# Patient Record
Sex: Female | Born: 1960 | Race: White | Hispanic: No | Marital: Married | Smoking: Never smoker
Health system: Southern US, Community
[De-identification: ages and names within clinical notes are randomized; demographics above are authoritative.]

## PROBLEM LIST (undated history)

## (undated) DIAGNOSIS — M1990 Unspecified osteoarthritis, unspecified site: Secondary | ICD-10-CM

## (undated) DIAGNOSIS — I4891 Unspecified atrial fibrillation: Secondary | ICD-10-CM

## (undated) DIAGNOSIS — S0300XA Dislocation of jaw, unspecified side, initial encounter: Secondary | ICD-10-CM

## (undated) DIAGNOSIS — E785 Hyperlipidemia, unspecified: Secondary | ICD-10-CM

## (undated) DIAGNOSIS — I10 Essential (primary) hypertension: Secondary | ICD-10-CM

## (undated) DIAGNOSIS — R0602 Shortness of breath: Secondary | ICD-10-CM

## (undated) DIAGNOSIS — E039 Hypothyroidism, unspecified: Secondary | ICD-10-CM

## (undated) DIAGNOSIS — R0781 Pleurodynia: Secondary | ICD-10-CM

## (undated) DIAGNOSIS — R7301 Impaired fasting glucose: Secondary | ICD-10-CM

## (undated) DIAGNOSIS — M810 Age-related osteoporosis without current pathological fracture: Secondary | ICD-10-CM

## (undated) DIAGNOSIS — Z9889 Other specified postprocedural states: Secondary | ICD-10-CM

## (undated) DIAGNOSIS — E119 Type 2 diabetes mellitus without complications: Secondary | ICD-10-CM

## (undated) DIAGNOSIS — R112 Nausea with vomiting, unspecified: Secondary | ICD-10-CM

## (undated) DIAGNOSIS — R002 Palpitations: Secondary | ICD-10-CM

## (undated) DIAGNOSIS — R9439 Abnormal result of other cardiovascular function study: Secondary | ICD-10-CM

## (undated) DIAGNOSIS — R079 Chest pain, unspecified: Secondary | ICD-10-CM

## (undated) DIAGNOSIS — R011 Cardiac murmur, unspecified: Secondary | ICD-10-CM

## (undated) DIAGNOSIS — Z6828 Body mass index (BMI) 28.0-28.9, adult: Secondary | ICD-10-CM

## (undated) HISTORY — DX: Age-related osteoporosis without current pathological fracture: M81.0

## (undated) HISTORY — PX: CERVICAL DISCECTOMY: SHX98

## (undated) HISTORY — DX: Body mass index (BMI) 28.0-28.9, adult: Z68.28

## (undated) HISTORY — DX: Palpitations: R00.2

## (undated) HISTORY — DX: Cardiac murmur, unspecified: R01.1

## (undated) HISTORY — PX: THYROIDECTOMY, PARTIAL: SHX18

## (undated) HISTORY — DX: Pleurodynia: R07.81

## (undated) HISTORY — DX: Hyperlipidemia, unspecified: E78.5

## (undated) HISTORY — DX: Abnormal result of other cardiovascular function study: R94.39

## (undated) HISTORY — DX: Impaired fasting glucose: R73.01

## (undated) HISTORY — DX: Unspecified atrial fibrillation: I48.91

## (undated) HISTORY — DX: Chest pain, unspecified: R07.9

## (undated) HISTORY — DX: Shortness of breath: R06.02

## (undated) HISTORY — DX: Unspecified osteoarthritis, unspecified site: M19.90

## (undated) HISTORY — PX: ABDOMINAL HYSTERECTOMY: SHX81

## (undated) HISTORY — PX: BACK SURGERY: SHX140

## (undated) HISTORY — DX: Hypothyroidism, unspecified: E03.9

## (undated) HISTORY — PX: COLONOSCOPY: SHX174

---

## 1998-12-29 ENCOUNTER — Other Ambulatory Visit: Admission: RE | Admit: 1998-12-29 | Discharge: 1998-12-29 | Payer: Self-pay | Admitting: Obstetrics and Gynecology

## 2000-05-03 ENCOUNTER — Other Ambulatory Visit: Admission: RE | Admit: 2000-05-03 | Discharge: 2000-05-03 | Payer: Self-pay | Admitting: *Deleted

## 2001-11-22 ENCOUNTER — Other Ambulatory Visit: Admission: RE | Admit: 2001-11-22 | Discharge: 2001-11-22 | Payer: Self-pay | Admitting: Obstetrics and Gynecology

## 2002-12-10 ENCOUNTER — Other Ambulatory Visit: Admission: RE | Admit: 2002-12-10 | Discharge: 2002-12-10 | Payer: Self-pay | Admitting: Obstetrics and Gynecology

## 2004-11-27 ENCOUNTER — Encounter (INDEPENDENT_AMBULATORY_CARE_PROVIDER_SITE_OTHER): Payer: Self-pay | Admitting: *Deleted

## 2004-11-27 ENCOUNTER — Observation Stay (HOSPITAL_COMMUNITY): Admission: RE | Admit: 2004-11-27 | Discharge: 2004-11-28 | Payer: Self-pay | Admitting: Obstetrics and Gynecology

## 2004-11-27 HISTORY — PX: ABDOMINAL HYSTERECTOMY: SHX81

## 2005-10-29 ENCOUNTER — Ambulatory Visit (HOSPITAL_COMMUNITY): Admission: RE | Admit: 2005-10-29 | Discharge: 2005-10-30 | Payer: Self-pay | Admitting: Specialist

## 2009-11-04 ENCOUNTER — Encounter: Payer: Self-pay | Admitting: Pulmonary Disease

## 2009-11-20 DIAGNOSIS — E039 Hypothyroidism, unspecified: Secondary | ICD-10-CM | POA: Insufficient documentation

## 2009-11-21 ENCOUNTER — Ambulatory Visit: Payer: Self-pay | Admitting: Pulmonary Disease

## 2009-11-21 DIAGNOSIS — R079 Chest pain, unspecified: Secondary | ICD-10-CM | POA: Insufficient documentation

## 2009-12-19 ENCOUNTER — Telehealth (INDEPENDENT_AMBULATORY_CARE_PROVIDER_SITE_OTHER): Payer: Self-pay | Admitting: *Deleted

## 2009-12-22 ENCOUNTER — Ambulatory Visit: Payer: Self-pay | Admitting: Internal Medicine

## 2010-07-14 NOTE — Assessment & Plan Note (Signed)
Summary: consult for atypical chest pain   Copy to:  [facility] Primary Provider/Referring Provider:  Burnell Blanks  CC:  Pulmonary Consult.  History of Present Illness: The pt is a 50y/o female who I have been asked to see for atypical chest pain.  In March of this year, she developed fever, congestion, cough with scant purulence, dyspnea, and a sharp right sided chest pain with radiation to her back.  She was seen at Urgent Care, and tells me that her cxr had some kind of abnormality.  I do not have that report or film for review.  She was treated with nsaids, robaxin for 2 weeks, and the pain improved but did not resolve.  She continued to have isolated "fullness in bottom of right lung",  but denied  having actual pain.  Her current discomfort level is rated 2-3/10.  The only time her chest actually hurts now is with deep breathing while singing.  She denies any further cough, congestion, or mucus.  She has no dyspnea above her usual baseline, and no sob with ADL's.  She did have a f/u cxr at [facility] the end of May which showed no acute process, but questioned whether the pt may have bullous changes at the apices? (but there was not hyperinflation).    Preventive Screening-Counseling & Management  Alcohol-Tobacco     Smoking Status: never  Current Medications (verified): 1)  Armour Thyroid 180 Mg Tabs (Thyroid) .Marland Kitchen.. 1 By Mouth Once Daily 2)  Fosamax 70 Mg Tabs (Alendronate Sodium) .... Take 1 Tab By Mouth Once A Week 3)  Vitamin D (Ergocalciferol) 50000 Unit Caps (Ergocalciferol) .... Take 1 Tab By Mouth Once A Week  Allergies (verified): No Known Drug Allergies  Past History:  Past Medical History:  HYPOTHYROIDISM (ICD-244.9)--?type    Past Surgical History: neck and back surgery partial thyroidectomy 2006 c-section  Family History: Reviewed history and no changes required. emphysema: maternal aunt, maternal great uncle heart disease: father, paternal  grandfather   Social History: Reviewed history and no changes required. Patient never smoked.  pt is married. pt has 3 children. pt works as a Garment/textile technologist. Smoking Status:  never  Review of Systems       The patient complains of chest pain.  The patient denies shortness of breath with activity, shortness of breath at rest, productive cough, non-productive cough, coughing up blood, irregular heartbeats, acid heartburn, indigestion, loss of appetite, weight change, abdominal pain, difficulty swallowing, sore throat, tooth/dental problems, headaches, nasal congestion/difficulty breathing through nose, sneezing, itching, ear ache, anxiety, depression, hand/feet swelling, joint stiffness or pain, rash, change in color of mucus, and fever.    Vital Signs:  Patient profile:   50 year old female Height:      64 inches Weight:      169 pounds BMI:     29.11 O2 Sat:      97 % on Room air Temp:     98.4 degrees F oral Pulse rate:   88 / minute BP sitting:   134 / 78  (left arm) Cuff size:   regular  Vitals Entered By: Arman Filter LPN (November 21, 2009 3:05 PM)  O2 Flow:  Room air CC: Pulmonary Consult Comments Medications reviewed with patient Arman Filter LPN  November 21, 2009 3:05 PM    Physical Exam  General:  ow female in nad Eyes:  PERRLA and EOMI.   Nose:  patent without discharge Mouth:  clear Neck:  no jvd, tmg,  LN Lungs:  totally clear to auscultation Heart:  rrr, no mrg Abdomen:  soft and nontender, bs+ Extremities:  no edema or cyanosis pulses intact distally Neurologic:  alert and oriented, moves all 4.   Impression & Recommendations:  Problem # 1:  CHEST PAIN, ATYPICAL (ICD-786.59)  the pt had what sounds like pleuritic chest pain associated with some type of pulmonary infection.  It is unclear whether this was an occult pna, or whether this was a viral process with pleurisy (coxsackie virus often causes pleurisy...."the grip").  Regardless, the pt is much  improved, and a f/u cxr showed no acute process.  The question was raised whether she may have "emphysema".  Spirometry was done today and showed no significant airflow obstruction.  I have asked her to give this a little more time.  If it does not resolve would consider checking ct chest.  Medications Added to Medication List This Visit: 1)  Fosamax 70 Mg Tabs (Alendronate sodium) .... Take 1 tab by mouth once a week 2)  Vitamin D (ergocalciferol) 50000 Unit Caps (Ergocalciferol) .... Take 1 tab by mouth once a week  Other Orders: Consultation Level IV (16109) Spirometry w/Graph (60454)  Patient Instructions: 1)  there is no evidence for emphysema or asthma on your breathing tests 2)  If your chest discomfort is not improving over next 4 weeks, will consider doing chest ct to evaluate further.   CardioPerfect Spirometry  ID: [id number] Patient: Claudia Shelton, Claudia Shelton DOB: Aug 10, 1960 Age: 50 Years Old Sex: Female Race: White Physician: Lamae Fosco Height: 64 Weight: 169 Status: Confirmed Recorded: 11/21/2009 3:42 PM  Parameter  Measured Predicted %Predicted FVC     2.58        3.51        73.50 FEV1     2.01        2.78        72.20 FEV1%   77.84        80.18        97.10 PEF    5.72        6.70        85.30   Interpretation: fvc is very mildly reduced, suggesting restriction there is no evidence for airflow obstruction

## 2010-07-14 NOTE — Progress Notes (Signed)
Summary: set up CT  Phone Note Call from Patient Call back at Home Phone [phone]   Caller: Patient Call For: clance Summary of Call: pt was seen a month ago for r lung pain. still has pain. was told to call if this continues and kc would have a CT scheduled.  Initial call taken by: Tivis Ringer, CNA,  December 19, 2009 10:04 AM  Follow-up for Phone Call        Pt c/o "right lung" pain is worse since last OV, "feels like something is in there", worse when exercising and laying down on back and right side. Pt c/o it being a sharp constant pain, 4 to 5 of 10. Last OV (11-21-2009), KC discussed if not better in 4 weeks to call and will have CT Chest. Please advise. Zackery Barefoot CMA  December 19, 2009 10:12 AM   Additional Follow-up for Phone Call Additional follow up Details #1::        will send order to [facility] to get chest ct.  let her know they will call her Additional Follow-up by: Barbaraann Share MD,  December 19, 2009 1:41 PM    Additional Follow-up for Phone Call Additional follow up Details #2::    [email or web address] Randell Loop CMA  December 19, 2009 1:49 PM   Spoke with pt.  She already had the ct done today.  Nothing needed. Follow-up by: Vernie Murders,  December 22, 2009 4:10 PM

## 2010-10-30 NOTE — Op Note (Signed)
NAMEMILLA, WAHLBERG                  ACCOUNT NO.:  [id number]   MEDICAL RECORD NO.:  [id number]          PATIENT TYPE:  OBV   LOCATION:  9399                          FACILITY:  [facility]   PHYSICIAN:  [place] B. Earlene Plater, M.D.  DATE OF BIRTH:  Jun 22, 1960   DATE OF PROCEDURE:  11/27/2004  DATE OF DISCHARGE:                                 OPERATIVE REPORT   PREOPERATIVE DIAGNOSIS:  Abnormal bleeding and uterine fibroids.   POSTOPERATIVE DIAGNOSIS:  Abnormal bleeding and uterine fibroids.   OPERATION/PROCEDURE:  Laparoscopic supracervical hysterectomy.   SURGEON:  Chester Holstein. Earlene Plater, M.D.   ASSISTANT:  Cordelia Pen A. Rosalio Macadamia, M.D.  Dr Pascal Lux   ANESTHESIA:  General.   SPECIMENS:  Uterus.   ESTIMATED BLOOD LOSS:  500 mL.   COMPLICATIONS:  None.   INDICATIONS:  The patient with a history of abnormal menstrual bleeding and  associated dysmenorrhea.  Ultrasound suggestive of uterine fibroids.  The  patient was not responding to medical management and desires definitive  minimally invasive surgical therapy.  The patient was advised of the 7% risk  of occasional staining or spotting with a supracervical hysterectomy as well  as a 1% risk of cyclical bleeding and the continued need for Pap smears  postoperatively.  The patient was advised of the risks of surgery including  infection, bleeding, damage to surrounding organs and potential need to  convert to other type of hysterectomy.   DESCRIPTION OF PROCEDURE:  The patient was taken to the operating room and  general anesthesia obtained.  She was prepped and draped in the standard  fashion and Foley catheter inserted into the bladder.  Legs were in Evergreen  stirrups.  Sponge stick placed in the vagina.   A 10 mm incision placed in the umbilicus, carried sharply to the fascia.  The fascia was divided sharply and elevated with Kocher clamps.  Posterior  sheath and peritoneum were entered sharply.  Pursestring suture of 0 Vicryl  placed around the  fascial defect.  Hasson cannula inserted and secured.  Pneumoperitoneum obtained with CO2 gas and Trendelenburg position obtained  and 12 mm trocars were placed in each lower quadrant under direct  laparoscopic visualization.  A 5 mm port was placed in the midline just  above the symphysis.   The course of each ureter was identified and the left round ligament  identified and divided with the Harmonic ACE.  The utero-ovarian ligaments  pedicles were divided in similar manner.  The broad ligaments were serially  clamped with the Harmonic ACE and divided down to the level of the uterine  artery.  The uterine artery was skeletonized and a bladder flap created with  the Harmonic.  The left uterine artery was then clamped with the Harmonic,  cauterized and divided.  This procedure was repeated in its entirety on the  right side in similar fashion.   The uterine cervix was then amputated in a reverse-cone fashion using the  Harmonic ACE in theMcCarus technique.  The cervical os was then cauterized  with the Harmonic for approximately 20 seconds.  There was  one bleeder at  the lateral aspect of the uterine stump on the right and left side.  This  was made hemostatic with the bipolar cautery.  Pelvis was irrigated and no  active bleeding noted.   The morcellator was then introduced.  The uterus was then morcellated in the  standard fashion and fragments removed.  Pelvic irrigated.  No active  bleeding noted.  The cervical stump was then closed with a running stitch of  0 Vicryl.  Interceed was then placed over the cervical stump.  The inferior  ports were removed and gas released. All sites of dissection were  hemostatic. The scope and Hasson cannula were then removed.  The umbilical  port was closed with the previously placed purse-string suture.  The left  lower quadrant and right lower quadrant 12 mm ports were each placed in a Z-  technique and, therefore, per the [place] data not necessary  to have fascial  closure.  Subcutaneous tissue was reapproximated on each side with 4-0  Vicryl.  The skin was closed on each side with Dermabond.   The patient tolerated the procedure well.  No complications.  She was taken  to the recovery room awake, alert and in stable condition.  All counts  correct per the operating room staff.  The weight of the uterus was 388 g.       WBD/MEDQ  D:  11/27/2004  T:  11/27/2004  Job:  [id number]

## 2010-10-30 NOTE — Op Note (Signed)
Claudia Shelton, Claudia Shelton                  ACCOUNT NO.:  [id number]   MEDICAL RECORD NO.:  [id number]          PATIENT TYPE:  AMB   LOCATION:  SDS                          FACILITY:  [facility]   PHYSICIAN:  Kerrin Champagne, M.D.   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   PROCEDURE:  Anterior cervical diskectomy with decompression of the cervical  cord at the C5-C6 level and fusion using a composite graft 8 mm in width in  combination with local bone graft, internal fixation with a 16 mm Alphatec  plate and 14 mm screws.   SURGEON:  Kerrin Champagne, M.D.   ASSISTANT:  Marlowe Kays, M.D.   ANESTHESIA:  General via oral tracheal intubation, Dr. Gypsy Balsam.   ESTIMATED BLOOD LOSS:  50 mL.   COMPLICATIONS:  None.   DISPOSITION:  Patient returned to the PACU in good condition.   HISTORY OF PRESENT ILLNESS:  This patient is a 50 year old right-hand  dominant female with a ten year history of neck pain and discomfort.  Over  the past four months, she has had worsening neck pain and shoulder pain now  with radiation of the right arm into her radial three fingers.  She has  undergone extensive evaluation and MRI study which demonstrates an area of  central stenosis at the C5-C6 level associated with spondylosis changes  involving the posterior aspect of the disk space at this segment causing  narrowing of the spinal canal to 7-8 mm with areas of cord edema.  She is  brought to the operating room to undergo decompression of the cervical cord  via anterior cervical diskectomy and fusion.  Excision of the posterior  endplate pars at the time of her surgery.   INTEROPERATIVE FINDINGS:  The patient was found to have significant  narrowing at  spinal canal associated with the ossification of the posterior  longitudinal ligament in addition to posterior lip osteophytes at the C5-C6  level due to the degenerative disk disease and spondylosis.  The right C6  cervical foramen was narrowed and this was decompressed performing  foraminotomy at this time of her diskectomy and fusion.   DESCRIPTION OF PROCEDURE:  After adequate general anesthesia, the patient in  a beach chair position, the neck in minimal extension, the [place]  horseshoe well padded holding the occiput, 5 pounds cervical halter  traction, a bump under her right buttock elevating the right iliac crest  anteriorly.  The anterior neck was then prepped with Betadine scrub and prep  solution, also over the right iliac crest.  Draped in the usual manner./  Vi-  Drape was used for the neck and right iliac crest.   The patient had the areas of her skin crease marked on the left side at the  expected C5-C6 level.  This was slightly lower  than the preoperative placed  mark on her neck still in line with the patient's skin crease.  This was  first infiltrated Marcaine 0.5% with 1:200,000 epinephrine.  An incision  approximately 2 1/2 inches in length through skin and subcutaneous layers  carried down to the platysma layer.  This was incised in line with the skin  incision.  The interval between the patient's sternocleidomastoid and medial  structures then developed using Metzenbaum scissors and a small pick up.  The interval between the trachea and esophagus medially and carotid sheath  was then developed using Metzenbaum scissors and the anterior aspect of the  cervical spine and the prevertebral fascial layer seen here.   The patient's anterior cervical structures were carefully retracted  medially.  The anterior prevertebral fascial layers were examined.  Here,  the layers were carefully spread identifying the esophagus medially, the  longus colli muscle and the  prevertebral fascia.  The prevertebral fascia  was incised after bipolar electrocautery was used to cauterize this tissue.  A 15 blade scalpel used to incise the prevertebral fascia.  It was then  teased across the midline using Kitner dissector as well as Art therapist  exposing the anterior aspect of the cervical spine.  The carotid tuberosity  was identified and the needle placed at the expected level here, using a  spinal needle with sheath intact only allowing 1 cm to be placed onto the  disk space.  Interoperative C-arm fluoro was then used to ascertain the  position alignment.  This appeared to be at least one disk space below the  expected C5-C6 level.  This needle was then carefully removed under direct  observation using handheld Cloward and [employer].  Kicker dissector was then  used to further expose the anterior aspect of the cervical spine extending  cranially an additional one segment where large anterior osteophytes were  found to be present marking the expected level of C5-C6.  The spinal needle  reintroduced at this level, again, only allowing 1 cm to protrude beyond the  sheath, inserted into the disk space at the C5-C6 segment.  Interoperative C-  arm fluoro was then used to ascertain the correct level of C5-C6 here.  Under direct observation, then this needle was removed and a 15 blade  scalpel used to remove a small portion of the anterior aspect of the annular  disk between osteophytes here, and this was then removed.  Using handheld  Cloward then, the medial border of the longus colli muscles were carefully  freed up bilaterally off the anterior aspect of the cervical spine.  This  was completed with Bovie electrocautery.  Bipolar cautery was used to  control the bleeders.  With this then, a McCullough retractor was inserted  with the blade beneath the longus colli muscle on both sides exposing the  anterior aspect of the disk space at the C5-C6 level.  Electrocautery  was used to debride the anterior aspect of the disk space at the level of the  anterior surface of the vertebral body of C5 and C6 of any soft tissue  material found be present debriding this back to the anterior osteophytes.  Osteophytes were then resected using 3 mm Kerrison.  The bone that was  removed was preserved for use for bone graft purposes within the allograft  to be used.  The endplates were then carefully curettaged of any  degenerative disk material as well as old degenerative cartilage over the  inferior aspect of C5  and the superior aspect of C6.  Once this was debrided  and removed, and the bony endplates carefully curettaged using 2-0  microcurets 3-0 microcurets, using this bone then morselized for the trans-  graft to be used.   The operating room microscope was sterilely draped and brought into field.  Prior to this, head lamp and loupe magnification was used.  Under the  operating room microscope then, a high-speed bur was used to carefully  decorticate the endplates of the inferior aspect of C5 and the superior  aspect of C6 back to the posterior lip osteophytes that were present.  These  were then carefully smoothed.  Once this had been done, a 3-0 straight  microcuret could be introduced and used to excise the small portion of the  posterior lip osteophytes over the left side of the C6 level posterior  superior osteophyte.  1 mm Kerrison was then able to be introduced and used  to excise the bony bridge at the C6 level transversely from the left side to  the right side.  This was then also used to remove bone from the posterior  inferior aspect of C5 on the left side up to an area that was more anterior  and superior to the patient's posterior lip osteophytes and this was  resected transversely from the left side and the right side, resecting the  bony material that was causing cord compression.  The bone material was  removed using pituitary rongeurs.  The  posterior longitudinal ligament  showed areas of calcification particularly centrally over the posterior  superior aspect of C6.  This was carefully developed over the lateral aspect  on the right side using 1 mm Kerrison dividing across this space and  resecting this nicely.  The posterior longitudinal ligament was then  completely resected, both left and right side, the spinal cord felt to be  well decompressed at this point.  A foraminotomy was performed on the right  side of the C6 level using 1 mm and 2 mm Kerrisons until the nerve root was  noted to be exiting anterolaterally without any further compression.  The  height of the intervertebral disk space was then measured using sounder.  An  8 mm sounder provided the best fit.  An 8 mm trans-graft was then obtained.  The Cloward depth gauge used to ascertain the depth of the intervertebral  disk space measured at 17 mm, trans-graft measured at 12 mm depth.  The  trans-graft was obtained sterilely and bone material that had been removed from this patient's decompression was then packed into the central hollow  area of the graft.  A lot of graft was present.  Irrigation was performed,  bony endplates that were bleeding were hemostased using thrombin-soaked  Gelfoam.  The trans-graft was then inserted into place after care was taken  to ensure there was no soft tissue that could be retropulsed with insertion  of graft.  The graft was then impacted into place and slightly subset by 1-2  mm.  The distraction was then released on the 14 mm screw posts that I had  inserted at the beginning of the case and used for distraction across disk  space.  Bone wax was applied to the bleeding screw holes.  Following this,  the 5 pounds of cervical halter traction was released.   Carefully, the anterior aspect of the vertebral body and inferior aspect of  C5 and superior aspect of C6 were debrided of any soft  tissue attachments  here.  And high-speed  bur then used to smooth the anterior lip osteophytes  here.  A 16 mm locking plate was then applied to the anterior aspect of the  cervical spine, a single retaining screw then placed on the left side at the  C6 level holding the plate and screw in excellent position and alignment.  The first screw hole was placed on the left side at C5, 14 mm drill and a 14  mm screw placed.  Then, a retaining screw was removed from the plate and  drilled with a 14 mm drill and then a 14 mm screw on the left side at C5-C6.  Right side C6 screw was next placed using a 14 mm drill then placing  appropriate screw and then on the right side at C5 drilling 14 mm and  placing the appropriate size 14 mm screw.  Inspection of the esophagus  demonstrated no abnormality.  Additional bone graft was placed along the  anterior left side of the graft through the opening of the anterior aspect  of the plate.   With this then, careful irrigation was performed.  Inspection of the  esophagus demonstrated no abnormalities.  The McCullough retractor was  removed.  Interoperative C-arm fluoro demonstrated the plates and screws in  excellent position, alignment screws extending up to the posterior aspect of  the vertebral body but not within the spinal canal proper.  The patient's  graft appeared to be in the anterior 2/3 of the disk space with no evidence  of impingement on the patient's spinal canal.  A 7-French drain was then  placed at the depth of the incision down near the plate on the left side  exiting out the anterior aspect through a separate area.  The platysma layer  was then reapproximated with interrupted 3-0 Vicryl suture after noting  there was no active bleeding present.  After approximating this layer, then  the subcu layers were approximated with interrupted 3-0 Vicryl sutures, skin  closed with Dermabond after applying further subcu sutures to approximate  the skin edges.  Dermabond was then applied nicely  holding skin edges  together year.  The 7-French drain was sewn in place with a 4-0 nylon stitch.  4x4s were affixed to the skin with Hypafix tape.  The patient's TLS  drain was charged.  The patient was then placed into a soft cervical collar,  reactivated, extubated, and returned to the recovery room in satisfactory  condition.  All instrument and sponge counts were correct.   INDICATIONS FOR ASSISTANT:  An assistant medical doctor surgeon was  necessary for suctioning and exposure of the anterior aspect of the cervical  spine for additional help in identifying areas of nerve root compression and  handling the instruments in the very delicate area of the patient's cervical  cord and right C6 nerve root.  Retraction of the cervical structures was  very important in terms of preventing postoperative complications.      Kerrin Champagne, M.D.  Electronically Signed     JEN/MEDQ  D:  10/29/2005  T:  10/30/2005  Job:  [id number]

## 2011-04-06 IMAGING — CT CT ANGIO CHEST
2 of 6 series · 19 of 36 positions shown · IV contrast (Omnipaque 300)
Comparison: Chest x-ray 10/07/2009

CLINICAL DATA: Right lower chest pain.  Shortness of breath.

CT ANGIOGRAPHY CHEST WITH CONTRAST
TECHNIQUE: Multidetector CT imaging of the chest was performed
using the standard protocol during bolus administration of
intravenous contrast.  Multiplanar CT image reconstructions
including MIPs were obtained to evaluate the vascular anatomy.
Contrast:  80 ml Imnipaque-3NN

[Series 5: thins (id) / (id) · axial · 0.62mm/px · z∈[-203,+20]mm · 18 of 249 slices shown]
[im 13/249  lung]
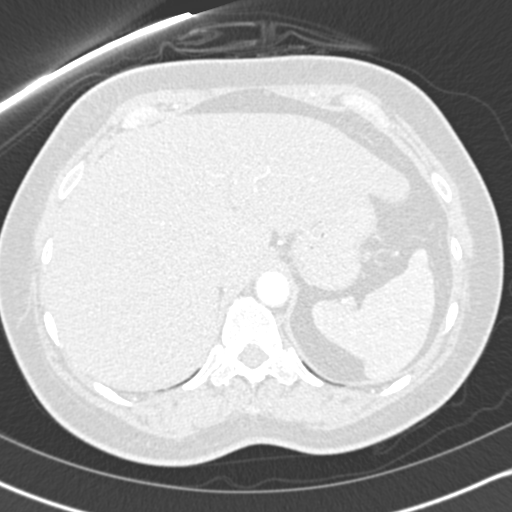
[im 25/249  mediastinal]
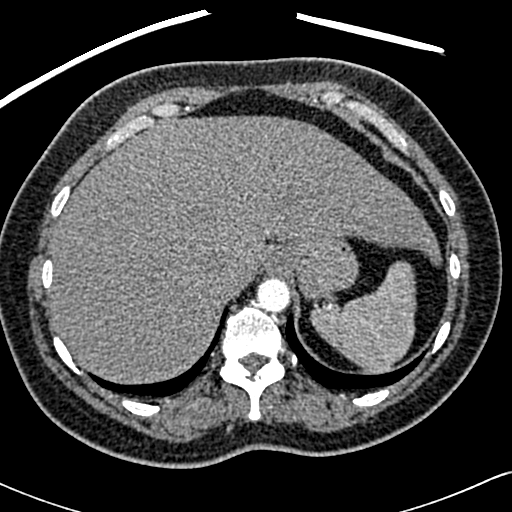
[im 38/249  lung]
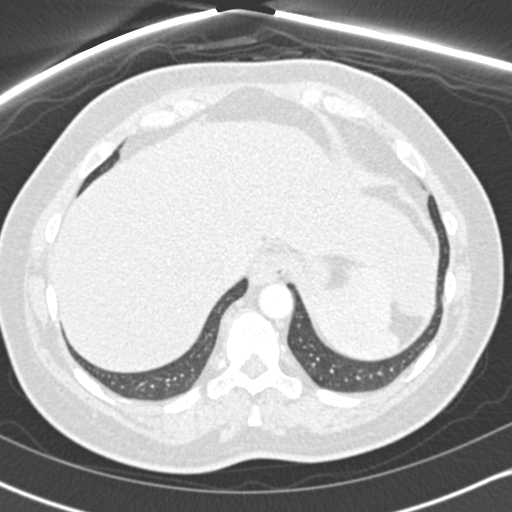
[im 50/249  mediastinal]
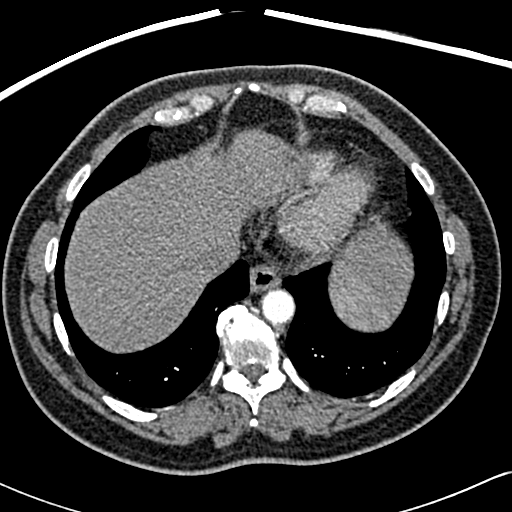
[im 63/249  lung]
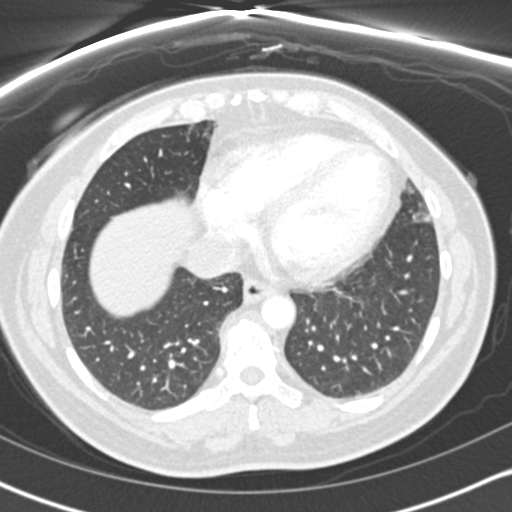
[im 75/249  mediastinal]
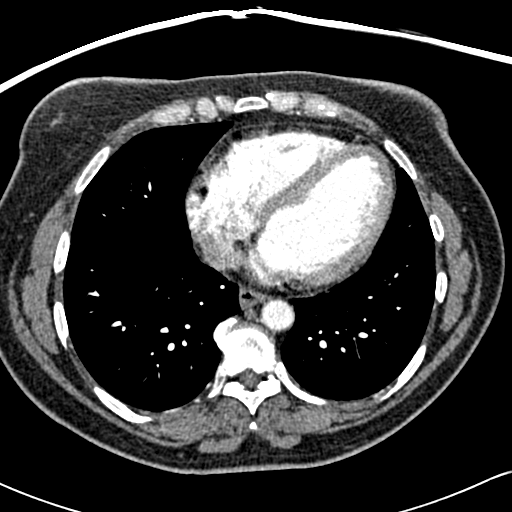
[im 87/249  lung]
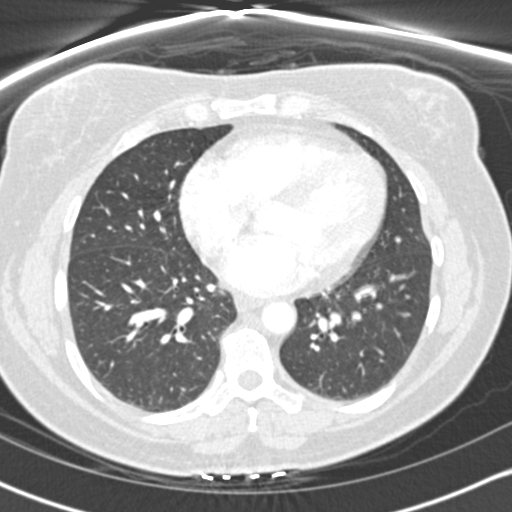
[im 100/249  mediastinal]
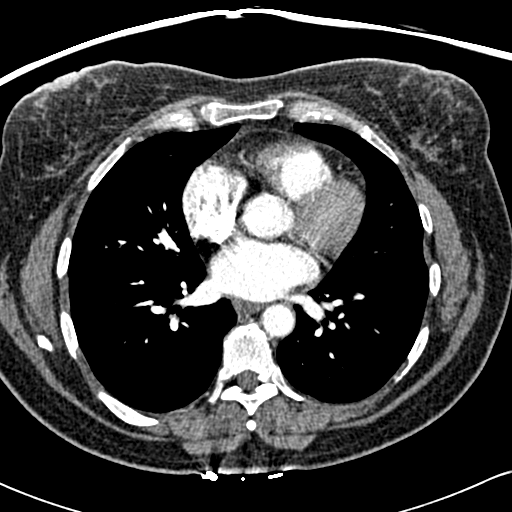
[im 112/249  lung]
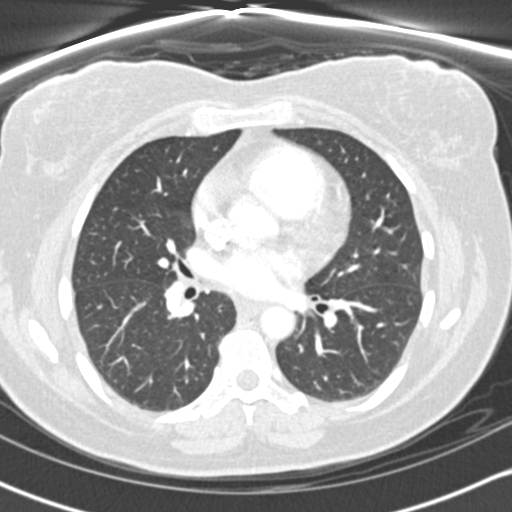
[im 137/249  mediastinal]
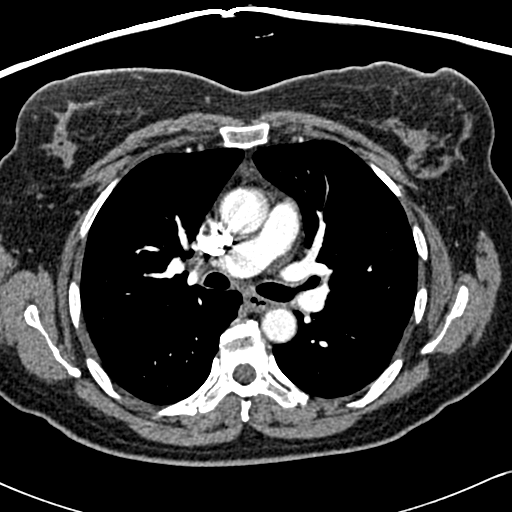
[im 149/249  lung]
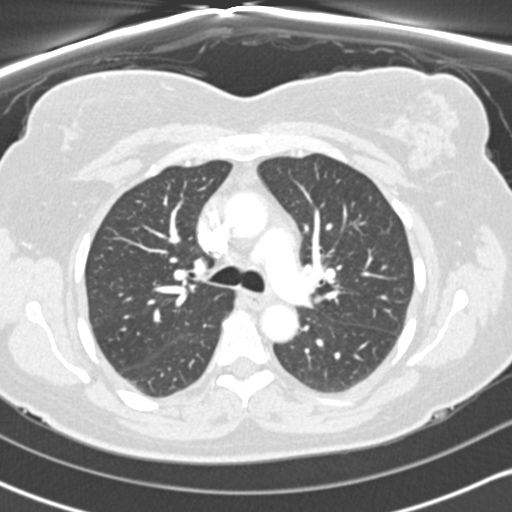
[im 162/249  mediastinal]
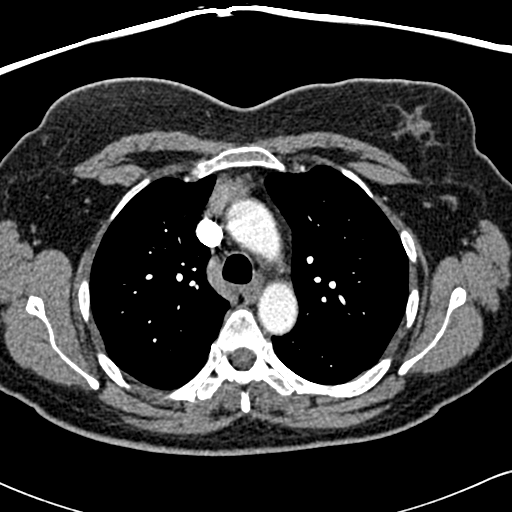
[im 174/249  lung]
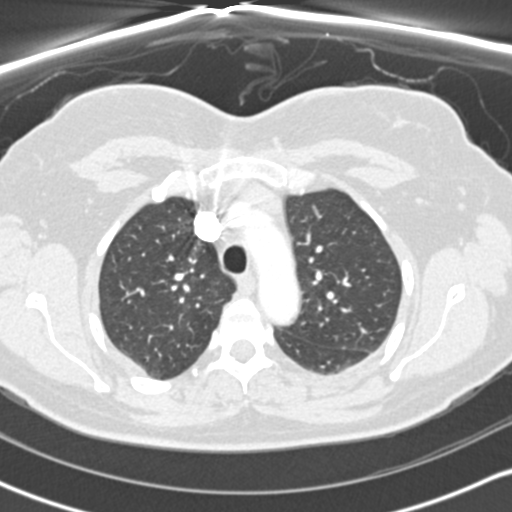
[im 187/249  mediastinal]
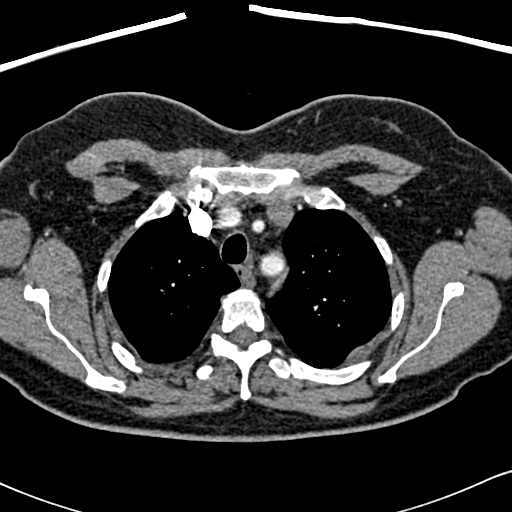
[im 199/249  lung]
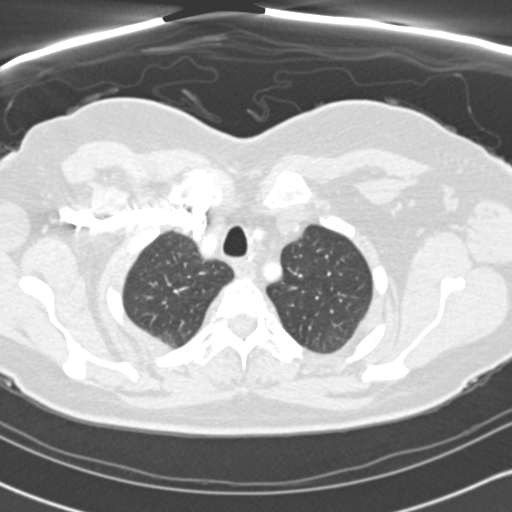
[im 211/249  mediastinal]
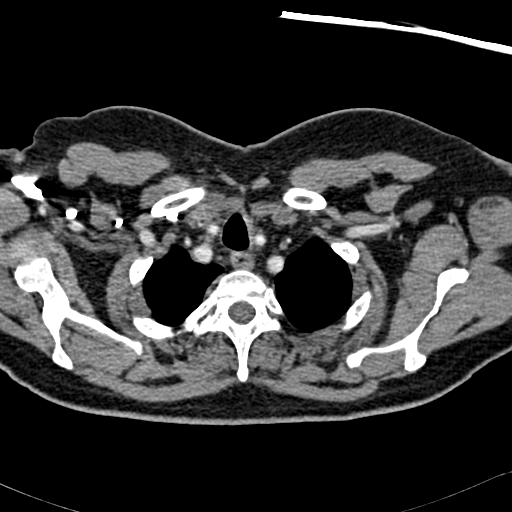
[im 224/249  lung]
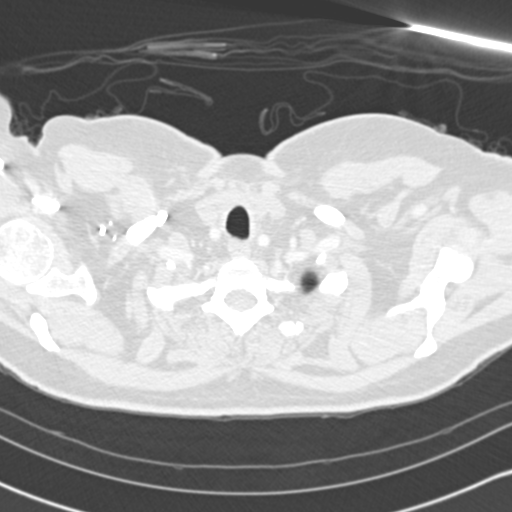
[im 236/249  mediastinal]
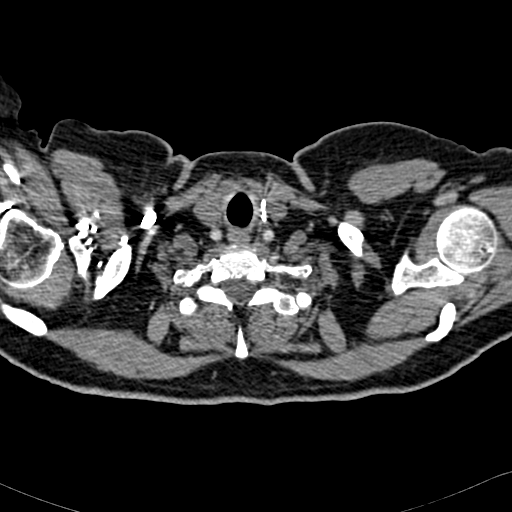

[Series 602: <mpr thick range> · coronal · 0.62mm/px · 1 of 106 slices shown]
[im 53/106  mediastinal]
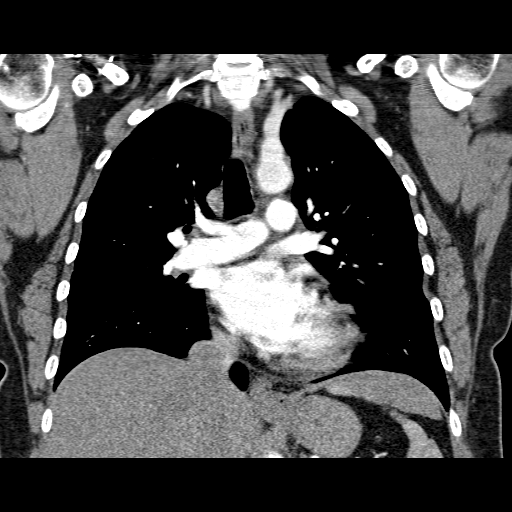

[19 of 36 positions shown; findings below may reference images not displayed]

FINDINGS: There are thyroidectomy changes.  There is soft tissue
in the prevascular space with fatty stippling, which is indicative
of thymic tissue.  No pathologically enlarged mediastinal, hilar or
axillary lymph nodes.  Heart is mildly enlarged.  No pericardial
effusion.  Tiny hiatal hernia.

Lungs are clear.  No pleural fluid.  Airway is unremarkable.

Incidental imaging of the upper abdomen shows no acute findings.
No worrisome lytic or sclerotic lesions.

Review of the MIP images confirms the above findings.
IMPRESSION: No pulmonary embolus.  No CT findings to explain the patient's
given symptoms.

## 2013-06-14 DIAGNOSIS — R0602 Shortness of breath: Secondary | ICD-10-CM

## 2013-06-14 DIAGNOSIS — R079 Chest pain, unspecified: Secondary | ICD-10-CM

## 2013-06-14 HISTORY — DX: Shortness of breath: R06.02

## 2013-06-14 HISTORY — DX: Chest pain, unspecified: R07.9

## 2014-04-09 ENCOUNTER — Encounter: Payer: Self-pay | Admitting: Internal Medicine

## 2014-04-26 ENCOUNTER — Institutional Professional Consult (permissible substitution): Payer: Self-pay | Admitting: Internal Medicine

## 2014-04-30 ENCOUNTER — Encounter: Payer: Self-pay | Admitting: Internal Medicine

## 2014-04-30 ENCOUNTER — Ambulatory Visit (INDEPENDENT_AMBULATORY_CARE_PROVIDER_SITE_OTHER): Payer: BC Managed Care – PPO | Admitting: Internal Medicine

## 2014-04-30 VITALS — BP 140/80 | HR 70 | Ht 63.0 in | Wt 164.2 lb

## 2014-04-30 DIAGNOSIS — R079 Chest pain, unspecified: Secondary | ICD-10-CM

## 2014-04-30 DIAGNOSIS — R002 Palpitations: Secondary | ICD-10-CM | POA: Insufficient documentation

## 2014-04-30 DIAGNOSIS — R0789 Other chest pain: Secondary | ICD-10-CM

## 2014-04-30 NOTE — Progress Notes (Signed)
HPI Claudia Shelton is referred today by Dr. Lisbeth Ply for evaluation of palpitations and exertional chest discomfort. She is an otherwise healthy middle-aged woman with diabetes and dyslipidemia, who is noted palpitations which have occurred for many years. These are not necessarily related to exertion. She describes an episode nearly one year ago where she almost passed out because of sustained heart racing. These episodes have not recurred, but she does feel like her heart beats irregularly irregular at times. She feels some type of palpitations almost every day. In addition, the patient notes substernal chest pressure which she states occurs with exertion. It improves with rest. She has not had syncope. There is no radiation to the neck, jaw, or arm. There is no associated nausea or vomiting. No Known Allergies   Current Outpatient Prescriptions  Medication Sig Dispense Refill  . alendronate (FOSAMAX) 70 MG tablet Take 70 mg by mouth once a week. Take with a full glass of water on an empty stomach.    . ezetimibe-simvastatin (VYTORIN) 10-20 MG per tablet Take 1 tablet by mouth daily.    . metFORMIN (GLUMETZA) 500 MG (MOD) 24 hr tablet Take 500 mg by mouth daily with breakfast.    . Multiple Vitamin (MULTIVITAMIN) capsule Take 1 capsule by mouth daily.    Marland Kitchen thyroid (ARMOUR) 180 MG tablet Take 180 mg by mouth daily.    . Vitamin D, Ergocalciferol, (DRISDOL) 50000 UNITS CAPS capsule Take 50,000 Units by mouth every 7 (seven) days.     No current facility-administered medications for this visit.     Past Medical History  Diagnosis Date  . Pleuritic pain     right lung base  . Jaw disease   . Hypothyroidism   . Prediabetes     F/B Dr. [name]  . Chest pain on exertion 2015  . SOB (shortness of breath) on exertion 2015  . Palpitations   . BMI 28.0-28.9,adult     ROS:   All systems reviewed and negative except as noted in the HPI.   Past Surgical History  Procedure Laterality  Date  . Thyroidectomy Left     "pre-cancerous cells"  . Complete hysterectomy      Not due to cancer. WITH OVARIES SPARED  . Cesarean section      x2  . Cervical discectomy       Family History  Problem Relation Age of Onset  . Diabetes Mellitus I Mother   . Hypertension Father   . Hyperlipidemia Father   . Stroke Paternal Aunt 43  . Heart attack Paternal Uncle   . Heart attack Paternal Grandfather 66  . Heart attack Cousin 45     History   Social History  . Marital Status: Married    Spouse Name: N/A    Number of Children: N/A  . Years of Education: N/A   Occupational History  . Not on file.   Social History Main Topics  . Smoking status: Never Smoker   . Smokeless tobacco: Not on file  . Alcohol Use: No  . Drug Use: Not on file  . Sexual Activity: Not on file   Other Topics Concern  . Not on file   Social History Narrative     BP 140/80 mmHg  Pulse 70  Ht 5\' 3"  (1.6 m)  Wt 164 lb 3.2 oz (74.481 kg)  BMI 29.09 kg/m2  Physical Exam:  Well appearing middle-aged woman, NAD HEENT: Unremarkable Neck:  No JVD, no thyromegally Back:  No CVA tenderness Lungs:  Clear with no wheezes, rales, or rhonchi. HEART:  Regular rate rhythm, no murmurs, no rubs, no clicks Abd:  soft, positive bowel sounds, no organomegally, no rebound, no guarding Ext:  2 plus pulses, no edema, no cyanosis, no clubbing Skin:  No rashes no nodules Neuro:  CN II through XII intact, motor grossly intact  EKG - normal sinus rhythm with normal axis and intervals.   Assess/Plan:

## 2014-04-30 NOTE — Assessment & Plan Note (Signed)
She has both typical as well as atypical chest pressure. I recommended that she undergo exercise treadmill testing. Additional evaluation will be based on the results of her exercise treadmill test.

## 2014-04-30 NOTE — Assessment & Plan Note (Signed)
The etiology of her symptoms is unclear. I suspect she is having PACs and/or PVCs. Because she has her episodes daily, I have recommended that she wear a 48 hour Holter. No medical therapy at this point. She is encouraged to refrain from caffeine and alcohol.

## 2014-04-30 NOTE — Patient Instructions (Signed)
Your physician recommends that you schedule a follow-up appointment in: 4 weeks with Dr. Lovena Le.   Your physician has requested that you have an exercise tolerance test. For further information please visit [email or web address]. Please also follow instruction sheet, as given.   Your physician has recommended that you wear a holter monitor. Holter monitors are medical devices that record the heart's electrical activity. Doctors most often use these monitors to diagnose arrhythmias. Arrhythmias are problems with the speed or rhythm of the heartbeat. The monitor is a small, portable device. You can wear one while you do your normal daily activities. This is usually used to diagnose what is causing palpitations/syncope (passing out).

## 2014-06-05 ENCOUNTER — Encounter: Payer: Self-pay | Admitting: Internal Medicine

## 2014-06-06 ENCOUNTER — Encounter: Payer: BC Managed Care – PPO | Admitting: *Deleted

## 2014-06-06 ENCOUNTER — Encounter (INDEPENDENT_AMBULATORY_CARE_PROVIDER_SITE_OTHER): Payer: BC Managed Care – PPO | Admitting: *Deleted

## 2014-06-06 ENCOUNTER — Ambulatory Visit (INDEPENDENT_AMBULATORY_CARE_PROVIDER_SITE_OTHER): Payer: BC Managed Care – PPO | Admitting: Physician Assistant

## 2014-06-06 VITALS — BP 149/86 | HR 77

## 2014-06-06 DIAGNOSIS — R079 Chest pain, unspecified: Secondary | ICD-10-CM

## 2014-06-06 DIAGNOSIS — R002 Palpitations: Secondary | ICD-10-CM

## 2014-06-06 DIAGNOSIS — R9439 Abnormal result of other cardiovascular function study: Secondary | ICD-10-CM

## 2014-06-06 MED ORDER — METOPROLOL SUCCINATE ER 25 MG PO TB24: 25.0000 mg | ORAL_TABLET | Freq: Every day | ORAL | Status: DC

## 2014-06-06 MED ORDER — NITROGLYCERIN 0.4 MG SL SUBL: 0.4000 mg | SUBLINGUAL_TABLET | SUBLINGUAL | Status: DC | PRN

## 2014-06-06 NOTE — H&P (Signed)
History and Physical   Date:  06/06/2014   ID:  Claudia Shelton, DOB 02-19-1961, MRN [id number]  PCP:  Leonides Sake, MD  Cardiologist:  Dr. Cristopher Peru     History of Present Illness: Claudia Shelton is a 53 y.o. female recently seen by Dr. Cristopher Peru for evaluation of palpitations and chest discomfort.  She has a hx of HL, pre-diabetes, cervical disc disease, hypothyroidism.  She was set up for an ETT that was done today. This was abnormal with development of chest pain during exercise, marked ST depression and hypertensive BP response.  Symptoms and ECGs resolved in recovery.  She will be set up for a L heart cath.   Recent Labs: No results found for requested labs within last 365 days.    Recent Radiology: No results found.    Wt Readings from Last 3 Encounters:  04/30/14 164 lb 3.2 oz (74.481 kg)  11/21/09 169 lb (76.658 kg)     Past Medical History  Diagnosis Date  . Pleuritic pain     right lung base  . Jaw disease   . Hypothyroidism   . Prediabetes     F/B Dr. [name]  . Chest pain on exertion 2015  . SOB (shortness of breath) on exertion 2015  . Palpitations   . BMI 28.0-28.9,adult     Current Outpatient Prescriptions  Medication Sig Dispense Refill  . alendronate (FOSAMAX) 70 MG tablet Take 70 mg by mouth once a week. Take with a full glass of water on an empty stomach.    . ezetimibe-simvastatin (VYTORIN) 10-20 MG per tablet Take 1 tablet by mouth daily.    . metFORMIN (GLUMETZA) 500 MG (MOD) 24 hr tablet Take 500 mg by mouth daily with breakfast.    . Multiple Vitamin (MULTIVITAMIN) capsule Take 1 capsule by mouth daily.    Marland Kitchen thyroid (ARMOUR) 180 MG tablet Take 180 mg by mouth daily.    . Vitamin D, Ergocalciferol, (DRISDOL) 50000 UNITS CAPS capsule Take 50,000 Units by mouth every 7 (seven) days.     No current facility-administered medications for this visit.     Allergies:   Review of patient's allergies indicates no known allergies.   Social  History:  The patient  reports that she has never smoked. She does not have any smokeless tobacco history on file. She reports that she does not drink alcohol.   Family History:  The patient's family history includes Diabetes Mellitus I in her mother; Heart attack in her paternal uncle; Heart attack (age of onset: 49) in her cousin; Heart attack (age of onset: 31) in her paternal grandfather; Hyperlipidemia in her father; Hypertension in her father; Stroke (age of onset: 44) in her paternal aunt.    ROS:  Please see the history of present illness.       All other systems reviewed and negative.    PHYSICAL EXAM: VS:  BP 149/86 mmHg  Pulse 77 Well nourished, well developed, in no acute distress HEENT: normal Neck:  no JVD Cardiac:  normal S1, S2;  RRR; no murmur   Lungs:   clear to auscultation bilaterally, no wheezing, rhonchi or rales Abd: soft, nontender, no hepatomegaly Ext: no edema Skin: warm and dry Neuro:  CNs 2-12 intact, no focal abnormalities noted       Exercise Treadmill Test  Pre-Exercise Testing Evaluation Rhythm: normal sinus  Rate: 77     Test  Exercise Tolerance Test Ordering MD: Cristopher Peru, MD  Interpreting  MD: Richardson Dopp, PA-C  Unique Test No: 1  Treadmill:  1  Indication for ETT: chest pain - rule out ischemia  Contraindication to ETT: No   Stress Modality: exercise - treadmill  Cardiac Imaging Performed: non   Protocol: standard Bruce - maximal  Max BP:  236/80  Max MPHR (bpm):  166 85% MPR (bpm):  141  MPHR obtained (bpm):  155 % MPHR obtained:  93  Reached 85% MPHR (min:sec):  4:18 Total Exercise Time (min-sec):  5:41  Workload in METS:  7.0 Borg Scale: 15  Reason ETT Terminated:  exaggerated hypertensive response    ST Segment Analysis At Rest: normal ST segments - no evidence of significant ST depression With Exercise: significant ischemic ST depression  Other Information Arrhythmia:  No Angina during ETT:  present (1) Quality of ETT:   diagnostic  ETT Interpretation:  abnormal - evidence of ST depression consistent with ischemia  Comments: Fair exercise capacity. Patient complained of chest pain during exercise. Exaggerated hypertensive BP response to exercise. There was significant 2-3 mm ST depression at peak exercise.   Recommendations: Discussed with Dr. Virl Axe. Arrange LHC.   ASSESSMENT AND PLAN:  1.  Chest Pain:  Her ETT is markedly abnormal.  She has exertional chest pain and significant risk factors for CAD.  I have reviewed her case with Dr. Virl Axe (DOD).  Cardiac catheterization has been recommended.  Risks and benefits of cardiac catheterization have been discussed with the patient.  These include bleeding, infection, kidney damage, stroke, heart attack, death.  The patient understands these risks and is willing to proceed.  This will be arranged next week.    -  Start Toprol-XL 25 mg QD.    -  Start ASA 81 mg QD.    -  Rx for NTG prn for chest pain.    -  Go to ED if symptoms change or worsen.  Disposition:   FU with me or Dr. Cristopher Peru 2 weeks after cardiac cath.    Signed, Versie Starks, MHS 06/06/2014 10:54 AM    [facility] [place], [place], [place] [employer]  [address] Phone: [phone]; Fax: [phone]

## 2014-06-06 NOTE — Progress Notes (Signed)
Patient ID: Claudia Shelton, female   DOB: December 20, 1960, 53 y.o.   MRN: [id number] [employer] 48 hour holter monitor applied with 7m red dot electrodes.  Patient has tape sensitivity.

## 2014-06-06 NOTE — Progress Notes (Signed)
Cardiology Office Note   Date:  06/06/2014   ID:  Claudia Shelton, DOB [birth year], MRN [id number]  PCP:  Leonides Sake, MD  Cardiologist:  Dr. Cristopher Peru     History of Present Illness: Claudia Shelton is a 53 y.o. female recently seen by Dr. Cristopher Peru for evaluation of palpitations and chest discomfort.  She has a hx of HL, pre-diabetes, cervical disc disease, hypothyroidism.  She was set up for an ETT that was done today. This was abnormal with development of chest pain during exercise, marked ST depression and hypertensive BP response.  Symptoms and ECGs resolved in recovery.  She will be set up for a L heart cath.   Recent Labs: No results found for requested labs within last 365 days.    Recent Radiology: No results found.    Wt Readings from Last 3 Encounters:  04/30/14 164 lb 3.2 oz (74.481 kg)  11/21/09 169 lb (76.658 kg)     Past Medical History  Diagnosis Date  . Pleuritic pain     right lung base  . Jaw disease   . Hypothyroidism   . Prediabetes     F/B Dr. [name]  . Chest pain on exertion 2015  . SOB (shortness of breath) on exertion 2015  . Palpitations   . BMI 28.0-28.9,adult     Current Outpatient Prescriptions  Medication Sig Dispense Refill  . alendronate (FOSAMAX) 70 MG tablet Take 70 mg by mouth once a week. Take with a full glass of water on an empty stomach.    . ezetimibe-simvastatin (VYTORIN) 10-20 MG per tablet Take 1 tablet by mouth daily.    . metFORMIN (GLUMETZA) 500 MG (MOD) 24 hr tablet Take 500 mg by mouth daily with breakfast.    . Multiple Vitamin (MULTIVITAMIN) capsule Take 1 capsule by mouth daily.    Marland Kitchen thyroid (ARMOUR) 180 MG tablet Take 180 mg by mouth daily.    . Vitamin D, Ergocalciferol, (DRISDOL) 50000 UNITS CAPS capsule Take 50,000 Units by mouth every 7 (seven) days.     No current facility-administered medications for this visit.     Allergies:   Review of patient's allergies indicates no known allergies.    Social History:  The patient  reports that she has never smoked. She does not have any smokeless tobacco history on file. She reports that she does not drink alcohol.   Family History:  The patient's family history includes Diabetes Mellitus I in her mother; Heart attack in her paternal uncle; Heart attack (age of onset: 24) in her cousin; Heart attack (age of onset: 29) in her paternal grandfather; Hyperlipidemia in her father; Hypertension in her father; Stroke (age of onset: 106) in her paternal aunt.    ROS:  Please see the history of present illness.       All other systems reviewed and negative.    PHYSICAL EXAM: VS:  BP 149/86 mmHg  Pulse 77 Well nourished, well developed, in no acute distress HEENT: normal Neck:  no JVD Cardiac:  normal S1, S2;  RRR; no murmur   Lungs:   clear to auscultation bilaterally, no wheezing, rhonchi or rales Abd: soft, nontender, no hepatomegaly Ext: no edema Skin: warm and dry Neuro:  CNs 2-12 intact, no focal abnormalities noted       Exercise Treadmill Test  Pre-Exercise Testing Evaluation Rhythm: normal sinus  Rate: 77     Test  Exercise Tolerance Test Ordering MD: Cristopher Peru, MD  Interpreting  MD: Richardson Dopp, PA-C  Unique Test No: 1  Treadmill:  1  Indication for ETT: chest pain - rule out ischemia  Contraindication to ETT: No   Stress Modality: exercise - treadmill  Cardiac Imaging Performed: non   Protocol: standard Bruce - maximal  Max BP:  236/80  Max MPHR (bpm):  166 85% MPR (bpm):  141  MPHR obtained (bpm):  155 % MPHR obtained:  93  Reached 85% MPHR (min:sec):  4:18 Total Exercise Time (min-sec):  5:41  Workload in METS:  7.0 Borg Scale: 15  Reason ETT Terminated:  exaggerated hypertensive response    ST Segment Analysis At Rest: normal ST segments - no evidence of significant ST depression With Exercise: significant ischemic ST depression  Other Information Arrhythmia:  No Angina during ETT:  present (1) Quality of  ETT:  diagnostic  ETT Interpretation:  abnormal - evidence of ST depression consistent with ischemia  Comments: Fair exercise capacity. Patient complained of chest pain during exercise. Exaggerated hypertensive BP response to exercise. There was significant 2-3 mm ST depression at peak exercise.   Recommendations: Discussed with Dr. Virl Axe. Arrange LHC.   ASSESSMENT AND PLAN:  1.  Chest Pain:  Her ETT is markedly abnormal.  She has exertional chest pain and significant risk factors for CAD.  I have reviewed her case with Dr. Virl Axe (DOD).  Cardiac catheterization has been recommended.  Risks and benefits of cardiac catheterization have been discussed with the patient.  These include bleeding, infection, kidney damage, stroke, heart attack, death.  The patient understands these risks and is willing to proceed.  This will be arranged next week.    -  Start Toprol-XL 25 mg QD.    -  Start ASA 81 mg QD.    -  Rx for NTG prn for chest pain.    -  Go to ED if symptoms change or worsen.  Disposition:   FU with me or Dr. Cristopher Peru 2 weeks after cardiac cath.    Signed, Versie Starks, MHS 06/06/2014 10:54 AM    [facility] [place], [place], [place]  [address] Phone: [phone]; Fax: [phone]

## 2014-06-06 NOTE — Patient Instructions (Signed)
Your physician has recommended you make the following change in your medication:   START TAKING NITROGLYCERIN 0.4 SUBLINGUAL TABLET IF NEEDED FOR  CHEST PAIN   START TAKING  TOPROL 25 MG ONCE A DAY   START TAKING  ASPIRIN 81 MG ONCE A DAY OVER THE COUNTER    Your physician has requested that you have a cardiac catheterization. Cardiac catheterization is used to diagnose and/or treat various heart conditions. Doctors may recommend this procedure for a number of different reasons. The most common reason is to evaluate chest pain. Chest pain can be a symptom of coronary artery disease (CAD), and cardiac catheterization can show whether plaque is narrowing or blocking your heart's arteries. This procedure is also used to evaluate the valves, as well as measure the blood flow and oxygen levels in different parts of your heart. For further information please visit [email or web address]. Please follow instruction sheet, as given.  YOU HAVE BEEN SCHEDULED FOR A LEFT HEART CATH ON 06/12/14 @ 10 AM  WITH DR [place] @ 8AM . PLEASE MAKES SURE YOU HAVE NOTHING TO EAT OR DRINK AFTER MIDNIGHT   THE MORNING OF THE PROCEDURE YOU MAY TAKE YOUR MEDS WITH WATER EXCEPT METFORMIN (HOLD 24 HOURS BEFORE PROCEDURE AND 48 HOURS AFTER PROCEDURE)  MAKE SURE YOU HAVE A CHANGE OF CLOTHING ALONG WITH A DESIGNATED DRIVER TO ASSIST YOU ONCE YOUR DISCHARGED TO [place]    Your physician recommends that you schedule a follow-up appointment in: WITH [facility]  FOR A 2 WEEKS POST CATH FOLLOW UP  AFTER 06/12/14    Nitroglycerin sublingual tablets What is this medicine? NITROGLYCERIN (nye troe GLI ser in) is a type of vasodilator. It relaxes blood vessels, increasing the blood and oxygen supply to your heart. This medicine is used to relieve chest pain caused by angina. It is also used to prevent chest pain before activities like climbing stairs, going outdoors in cold weather, or sexual  activity. This medicine may be used for other purposes; ask your health care provider or pharmacist if you have questions. COMMON BRAND NAME(S): Nitroquick, Nitrostat, Nitrotab What should I tell my health care provider before I take this medicine? They need to know if you have any of these conditions: -anemia -head injury, recent stroke, or bleeding in the brain -liver disease -previous heart attack -an unusual or allergic reaction to nitroglycerin, other medicines, foods, dyes, or preservatives -pregnant or trying to get pregnant -breast-feeding How should I use this medicine? Take this medicine by mouth as needed. At the first sign of an angina attack (chest pain or tightness) place one tablet under your tongue. You can also take this medicine 5 to 10 minutes before an event likely to produce chest pain. Follow the directions on the prescription label. Let the tablet dissolve under the tongue. Do not swallow whole. Replace the dose if you accidentally swallow it. It will help if your mouth is not dry. Saliva around the tablet will help it to dissolve more quickly. Do not eat or drink, smoke or chew tobacco while a tablet is dissolving. If you are not better within 5 minutes after taking ONE dose of nitroglycerin, call 9-1-1 immediately to seek emergency medical care. Do not take more than 3 nitroglycerin tablets over 15 minutes. If you take this medicine often to relieve symptoms of angina, your doctor or health care professional may provide you with different instructions to manage your symptoms. If symptoms do not go  away after following these instructions, it is important to call 9-1-1 immediately. Do not take more than 3 nitroglycerin tablets over 15 minutes. Talk to your pediatrician regarding the use of this medicine in children. Special care may be needed. Overdosage: If you think you have taken too much of this medicine contact a poison control center or emergency room at once. NOTE: This  medicine is only for you. Do not share this medicine with others. What if I miss a dose? This does not apply. This medicine is only used as needed. What may interact with this medicine? Do not take this medicine with any of the following medications: -certain migraine medicines like ergotamine and dihydroergotamine (DHE) -medicines used to treat erectile dysfunction like sildenafil, tadalafil, and vardenafil -riociguat This medicine may also interact with the following medications: -alteplase -aspirin -heparin -medicines for high blood pressure -medicines for mental depression -other medicines used to treat angina -phenothiazines like chlorpromazine, mesoridazine, prochlorperazine, thioridazine This list may not describe all possible interactions. Give your health care provider a list of all the medicines, herbs, non-prescription drugs, or dietary supplements you use. Also tell them if you smoke, drink alcohol, or use illegal drugs. Some items may interact with your medicine. What should I watch for while using this medicine? Tell your doctor or health care professional if you feel your medicine is no longer working. Keep this medicine with you at all times. Sit or lie down when you take your medicine to prevent falling if you feel dizzy or faint after using it. Try to remain calm. This will help you to feel better faster. If you feel dizzy, take several deep breaths and lie down with your feet propped up, or bend forward with your head resting between your knees. You may get drowsy or dizzy. Do not drive, use machinery, or do anything that needs mental alertness until you know how this drug affects you. Do not stand or sit up quickly, especially if you are an older patient. This reduces the risk of dizzy or fainting spells. Alcohol can make you more drowsy and dizzy. Avoid alcoholic drinks. Do not treat yourself for coughs, colds, or pain while you are taking this medicine without asking your  doctor or health care professional for advice. Some ingredients may increase your blood pressure. What side effects may I notice from receiving this medicine? Side effects that you should report to your doctor or health care professional as soon as possible: -blurred vision -dry mouth -skin rash -sweating -the feeling of extreme pressure in the head -unusually weak or tired Side effects that usually do not require medical attention (report to your doctor or health care professional if they continue or are bothersome): -flushing of the face or neck -headache -irregular heartbeat, palpitations -nausea, vomiting This list may not describe all possible side effects. Call your doctor for medical advice about side effects. You may report side effects to FDA at 1-800-FDA-1088. Where should I keep my medicine? Keep out of the reach of children. Store at room temperature between 20 and 25 degrees C (68 and 77 degrees F). Store in [facility]. Protect from light and moisture. Keep tightly closed. Throw away any unused medicine after the expiration date. NOTE: This sheet is a summary. It may not cover all possible information. If you have questions about this medicine, talk to your doctor, pharmacist, or health care provider.  2015, Elsevier/Gold Standard. (2013-03-29 17:57:36)

## 2014-06-10 ENCOUNTER — Telehealth: Payer: Self-pay | Admitting: Interventional Cardiology

## 2014-06-12 ENCOUNTER — Encounter (HOSPITAL_COMMUNITY)
Admission: RE | Disposition: A | Discharge status: Home / Self Care | Payer: Self-pay | Source: Ambulatory Visit | Attending: Interventional Cardiology

## 2014-06-12 ENCOUNTER — Ambulatory Visit (HOSPITAL_COMMUNITY)
Admission: RE | Admit: 2014-06-12 | Discharge: 2014-06-12 | Disposition: A | Discharge status: Home / Self Care | Payer: BC Managed Care – PPO | Source: Ambulatory Visit | Attending: Interventional Cardiology | Admitting: Interventional Cardiology

## 2014-06-12 ENCOUNTER — Encounter (HOSPITAL_COMMUNITY): Payer: Self-pay | Admitting: Interventional Cardiology

## 2014-06-12 DIAGNOSIS — R9431 Abnormal electrocardiogram [ECG] [EKG]: Secondary | ICD-10-CM | POA: Diagnosis not present

## 2014-06-12 DIAGNOSIS — R079 Chest pain, unspecified: Secondary | ICD-10-CM | POA: Diagnosis present

## 2014-06-12 DIAGNOSIS — R9439 Abnormal result of other cardiovascular function study: Secondary | ICD-10-CM | POA: Insufficient documentation

## 2014-06-12 DIAGNOSIS — I209 Angina pectoris, unspecified: Secondary | ICD-10-CM | POA: Diagnosis present

## 2014-06-12 DIAGNOSIS — E039 Hypothyroidism, unspecified: Secondary | ICD-10-CM | POA: Insufficient documentation

## 2014-06-12 DIAGNOSIS — E785 Hyperlipidemia, unspecified: Secondary | ICD-10-CM | POA: Diagnosis not present

## 2014-06-12 DIAGNOSIS — I5032 Chronic diastolic (congestive) heart failure: Secondary | ICD-10-CM | POA: Insufficient documentation

## 2014-06-12 DIAGNOSIS — I10 Essential (primary) hypertension: Secondary | ICD-10-CM | POA: Diagnosis not present

## 2014-06-12 DIAGNOSIS — Z8249 Family history of ischemic heart disease and other diseases of the circulatory system: Secondary | ICD-10-CM | POA: Insufficient documentation

## 2014-06-12 HISTORY — PX: LEFT HEART CATHETERIZATION WITH CORONARY ANGIOGRAM: SHX5451

## 2014-06-12 LAB — CBC
HCT: 43.4 % (ref 36.0–46.0)
Hemoglobin: 14.5 g/dL (ref 12.0–15.0)
MCH: 29.6 pg (ref 26.0–34.0)
MCHC: 33.4 g/dL (ref 30.0–36.0)
MCV: 88.6 fL (ref 78.0–100.0)
PLATELETS: 244 10*3/uL (ref 150–400)
RBC: 4.9 MIL/uL (ref 3.87–5.11)
RDW: 12.8 % (ref 11.5–15.5)
WBC: 4.6 10*3/uL (ref 4.0–10.5)

## 2014-06-12 LAB — BASIC METABOLIC PANEL
Anion gap: 8 (ref 5–15)
BUN: 13 mg/dL (ref 6–23)
CO2: 28 mmol/L (ref 19–32)
CREATININE: 0.74 mg/dL (ref 0.50–1.10)
Calcium: 9.1 mg/dL (ref 8.4–10.5)
Chloride: 104 mEq/L (ref 96–112)
GFR calc Af Amer: >90 mL/min (ref >90)
Glucose, Bld: 126 mg/dL — ABNORMAL HIGH (ref 70–99)
Potassium: 3.7 mmol/L (ref 3.5–5.1)
SODIUM: 140 mmol/L (ref 135–145)

## 2014-06-12 LAB — PROTIME-INR
INR: 0.93 (ref 0.00–1.49)
Prothrombin Time: 12.5 seconds (ref 11.6–15.2)

## 2014-06-12 LAB — GLUCOSE, CAPILLARY: Glucose-Capillary: 120 mg/dL — ABNORMAL HIGH (ref 70–99)

## 2014-06-12 SURGERY — LEFT HEART CATHETERIZATION WITH CORONARY ANGIOGRAM
Anesthesia: LOCAL

## 2014-06-12 MED ORDER — ASPIRIN 81 MG PO CHEW
81.0000 mg | CHEWABLE_TABLET | ORAL | Status: AC
  Administered 2014-06-12: 81 mg via ORAL

## 2014-06-12 MED ORDER — ACETAMINOPHEN 325 MG PO TABS: 650.0000 mg | ORAL_TABLET | ORAL | Status: DC | PRN

## 2014-06-12 MED ORDER — ONDANSETRON HCL 4 MG/2ML IJ SOLN: 4.0000 mg | Freq: Four times a day (QID) | INTRAMUSCULAR | Status: DC | PRN

## 2014-06-12 MED ORDER — FENTANYL CITRATE 0.05 MG/ML IJ SOLN
INTRAMUSCULAR | Status: AC
  Filled 2014-06-12: qty 2

## 2014-06-12 MED ORDER — MIDAZOLAM HCL 2 MG/2ML IJ SOLN
INTRAMUSCULAR | Status: AC
  Filled 2014-06-12: qty 2

## 2014-06-12 MED ORDER — ASPIRIN 81 MG PO CHEW
CHEWABLE_TABLET | ORAL | Status: AC
  Administered 2014-06-12: 81 mg via ORAL
  Filled 2014-06-12: qty 1

## 2014-06-12 MED ORDER — HEPARIN (PORCINE) IN NACL 2-0.9 UNIT/ML-% IJ SOLN
INTRAMUSCULAR | Status: AC
  Filled 2014-06-12: qty 1000

## 2014-06-12 MED ORDER — SODIUM CHLORIDE 0.9 % IJ SOLN: 3.0000 mL | Freq: Two times a day (BID) | INTRAMUSCULAR | Status: DC

## 2014-06-12 MED ORDER — OXYCODONE-ACETAMINOPHEN 5-325 MG PO TABS
1.0000 | ORAL_TABLET | ORAL | Status: DC | PRN
  Administered 2014-06-12: 1 via ORAL

## 2014-06-12 MED ORDER — SODIUM CHLORIDE 0.9 % IJ SOLN: 3.0000 mL | INTRAMUSCULAR | Status: DC | PRN

## 2014-06-12 MED ORDER — LIDOCAINE HCL (PF) 1 % IJ SOLN
INTRAMUSCULAR | Status: AC
  Filled 2014-06-12: qty 30

## 2014-06-12 MED ORDER — SODIUM CHLORIDE 0.9 % IV SOLN: INTRAVENOUS | Status: AC

## 2014-06-12 MED ORDER — OXYCODONE-ACETAMINOPHEN 5-325 MG PO TABS
ORAL_TABLET | ORAL | Status: AC
  Filled 2014-06-12: qty 1

## 2014-06-12 MED ORDER — SODIUM CHLORIDE 0.9 % IV SOLN
INTRAVENOUS | Status: DC
  Administered 2014-06-12: 09:00:00 via INTRAVENOUS

## 2014-06-12 MED ORDER — VERAPAMIL HCL 2.5 MG/ML IV SOLN
INTRAVENOUS | Status: AC
  Filled 2014-06-12: qty 2

## 2014-06-12 MED ORDER — SODIUM CHLORIDE 0.9 % IV SOLN: 250.0000 mL | INTRAVENOUS | Status: DC | PRN

## 2014-06-12 MED ORDER — HEPARIN SODIUM (PORCINE) 1000 UNIT/ML IJ SOLN
INTRAMUSCULAR | Status: AC
  Filled 2014-06-12: qty 1

## 2014-06-12 NOTE — CV Procedure (Signed)
     Left Heart Catheterization with Coronary Angiography  Report  Claudia Shelton  53 y.o.  female [birth year]  Procedure Date: 06/12/2014 Referring Physician: Richardson Dopp, PA-C. Primary Cardiologist:: Crissie Sickles, M.D.  INDICATIONS: Abnormal exercise treadmill test, hypertensive blood pressure response, and history of exertional chest tightness.  PROCEDURE: 1. Left heart catheterization; 2. Coronary angiography; 3. Left ventriculography  CONSENT:  The risks, benefits, and details of the procedure were explained in detail to the patient. Risks including death, stroke, heart attack, kidney injury, allergy, limb ischemia, bleeding and radiation injury were discussed.  The patient verbalized understanding and wanted to proceed.  Informed written consent was obtained.  PROCEDURE TECHNIQUE:  After Xylocaine anesthesia a 5 French Slender sheath was placed in the right radial artery with an angiocath and the modified Seldinger technique.  Coronary angiography was done using a 5 F JR 4 and JL 3.5 cm diagnostic catheter.  Left ventriculography was done using the JR 4 catheter and hand injection.   Images were reviewed and the case was terminated. Hemostasis was achieved with a wrist band.   CONTRAST:  Total of 110 cc.  COMPLICATIONS:  None   HEMODYNAMICS:  Aortic pressure 150/83 mmHg; LV pressure 154/9 mmHg; LVEDP 23 mmHg   ANGIOGRAPHIC DATA:   The left main coronary artery is normal.  The left anterior descending artery is transapical and normal..  The left circumflex artery is normal.  The right coronary artery is dominant and normal.    LEFT VENTRICULOGRAM:  Left ventricular angiogram was done in the 30 RAO projection and revealed normal cavity size and hyperdynamic contractility. EF is estimated to be greater than 65%.   IMPRESSIONS:  1. Normal coronary arteries 2. Hyperdynamic left ventricular systolic function. Chronic diastolic heart failure with elevated end-diastolic  pressures. 3. Essential hypertension   RECOMMENDATION:  Aggressive blood pressure control and risk factor modification.

## 2014-06-12 NOTE — Interval H&P Note (Signed)
Cath Lab Visit (complete for each Cath Lab visit)  Clinical Evaluation Leading to the Procedure:   ACS: No.  Non-ACS:    Anginal Classification: CCS III  Anti-ischemic medical therapy: Minimal Therapy (1 class of medications)  Non-Invasive Test Results: Intermediate-risk stress test findings: cardiac mortality 1-3%/year  Prior CABG: No previous CABG      History and Physical Interval Note:  06/12/2014 10:07 AM  Claudia Shelton  has presented today for surgery, with the diagnosis of excertional angina, abnormal stress test  The various methods of treatment have been discussed with the patient and family. After consideration of risks, benefits and other options for treatment, the patient has consented to  Procedure(s): LEFT HEART CATHETERIZATION WITH CORONARY ANGIOGRAM (N/A) as a surgical intervention .  The patient's history has been reviewed, patient examined, no change in status, stable for surgery.  I have reviewed the patient's chart and labs.  Questions were answered to the patient's satisfaction.     [name]

## 2014-06-12 NOTE — Discharge Instructions (Signed)
Radial Site Care °Refer to this sheet in the next few weeks. These instructions provide you with information on caring for yourself after your procedure. Your caregiver may also give you more specific instructions. Your treatment has been planned according to current medical practices, but problems sometimes occur. Call your caregiver if you have any problems or questions after your procedure. °HOME CARE INSTRUCTIONS °· You may shower the day after the procedure. Remove the bandage (dressing) and gently wash the site with plain soap and water. Gently pat the site dry. °· Do not apply powder or lotion to the site. °· Do not submerge the affected site in water for 3 to 5 days. °· Inspect the site at least twice daily. °· Do not flex or bend the affected arm for 24 hours. °· No lifting over 5 pounds (2.3 kg) for 5 days after your procedure. °· Do not drive home if you are discharged the same day of the procedure. Have someone else drive you. °· You may drive 24 hours after the procedure unless otherwise instructed by your caregiver. °· Do not operate machinery or power tools for 24 hours. °· A responsible adult should be with you for the first 24 hours after you arrive home. °What to expect: °· Any bruising will usually fade within 1 to 2 weeks. °· Blood that collects in the tissue (hematoma) may be painful to the touch. It should usually decrease in size and tenderness within 1 to 2 weeks. °SEEK IMMEDIATE MEDICAL CARE IF: °· You have unusual pain at the radial site. °· You have redness, warmth, swelling, or pain at the radial site. °· You have drainage (other than a small amount of blood on the dressing). °· You have chills. °· You have a fever or persistent symptoms for more than 72 hours. °· You have a fever and your symptoms suddenly get worse. °· Your arm becomes pale, cool, tingly, or numb. °· You have heavy bleeding from the site. Hold pressure on the site and call 911. °Document Released: 07/03/2010 Document  Revised: 08/23/2011 Document Reviewed: 07/03/2010 °ExitCare® Patient Information ©2015 [employer]. This information is not intended to replace advice given to you by your health care provider. Make sure you discuss any questions you have with your health care provider. ° °

## 2014-07-08 ENCOUNTER — Ambulatory Visit: Payer: BC Managed Care – PPO | Admitting: Physician Assistant

## 2014-07-16 ENCOUNTER — Encounter: Payer: Self-pay | Admitting: Internal Medicine

## 2014-07-16 ENCOUNTER — Ambulatory Visit (INDEPENDENT_AMBULATORY_CARE_PROVIDER_SITE_OTHER): Payer: BC Managed Care – PPO | Admitting: Internal Medicine

## 2014-07-16 VITALS — BP 130/69 | HR 63 | Ht 63.0 in | Wt 160.2 lb

## 2014-07-16 DIAGNOSIS — R002 Palpitations: Secondary | ICD-10-CM

## 2014-07-16 DIAGNOSIS — R0789 Other chest pain: Secondary | ICD-10-CM

## 2014-07-16 NOTE — Assessment & Plan Note (Signed)
Despite her abnormal stress test, she had no CAD by catheterization. Will follow.

## 2014-07-16 NOTE — Progress Notes (Signed)
HPI Claudia Shelton returns today for followup. I had seen her several months ago with palpitations and chest pressure. Subsequent evaluation with a 48 hour holter demonstrated rare PVC's and NS SVT (less than 5 seconds) and she had an abnormal stress test and underwent catheterization which demonstrated normal LV function and no CAD. In the interim she has been stable. She still has rare palpitations and dyspnea with exertion.  Allergies  Allergen Reactions  . Adhesive [Tape] Hives and Itching  . Latex Hives     Current Outpatient Prescriptions  Medication Sig Dispense Refill  . alendronate (FOSAMAX) 70 MG tablet Take 70 mg by mouth once a week. Take with a full glass of water on an empty stomach.    Francia Greaves THYROID 60 MG tablet Take 180 mg by mouth daily.     Marland Kitchen aspirin 81 MG chewable tablet Chew 81 mg by mouth daily.     Marland Kitchen ezetimibe-simvastatin (VYTORIN) 10-20 MG per tablet Take 1 tablet by mouth daily.    . metFORMIN (GLUCOPHAGE-XR) 500 MG 24 hr tablet Take 500 mg by mouth daily.    . metoprolol succinate (TOPROL XL) 25 MG 24 hr tablet Take 1 tablet (25 mg total) by mouth daily. 30 tablet 6  . Multiple Vitamin (MULTIVITAMIN) capsule Take 1 capsule by mouth daily.    . Vitamin D, Ergocalciferol, (DRISDOL) 50000 UNITS CAPS capsule Take 50,000 Units by mouth every 7 (seven) days.    . nitroGLYCERIN (NITROSTAT) 0.4 MG SL tablet Place 1 tablet (0.4 mg total) under the tongue every 5 (five) minutes as needed for chest pain. (Patient not taking: Reported on 07/16/2014) 25 tablet 3   No current facility-administered medications for this visit.     Past Medical History  Diagnosis Date  . Pleuritic pain     right lung base  . Jaw disease   . Hypothyroidism   . Prediabetes     F/B Dr. [name]  . Chest pain on exertion 2015  . SOB (shortness of breath) on exertion 2015  . Palpitations   . BMI 28.0-28.9,adult     ROS:   All systems reviewed and negative except as noted in the  HPI.   Past Surgical History  Procedure Laterality Date  . Thyroidectomy Left     "pre-cancerous cells"  . Complete hysterectomy      Not due to cancer. WITH OVARIES SPARED  . Cesarean section      x2  . Cervical discectomy    . Left heart catheterization with coronary angiogram N/A 06/12/2014    Procedure: LEFT HEART CATHETERIZATION WITH CORONARY ANGIOGRAM;  Surgeon: [name], MD;  Location: [facility] CATH LAB;  Service: Cardiovascular;  Laterality: N/A;     Family History  Problem Relation Age of Onset  . Diabetes Mellitus I Mother   . Hypertension Father   . Hyperlipidemia Father   . Stroke Paternal Aunt 17  . Heart attack Paternal Uncle   . Heart attack Paternal Grandfather 55  . Heart attack Cousin 45     History   Social History  . Marital Status: Married    Spouse Name: N/A    Number of Children: N/A  . Years of Education: N/A   Occupational History  . Not on file.   Social History Main Topics  . Smoking status: Never Smoker   . Smokeless tobacco: Not on file  . Alcohol Use: No  . Drug Use: Not on file  .  Sexual Activity: Not on file   Other Topics Concern  . Not on file   Social History Narrative     BP 130/69 mmHg  Pulse 63  Ht 5\' 3"  (1.6 m)  Wt 160 lb 3.2 oz (72.666 kg)  BMI 28.39 kg/m2  Physical Exam:  Well appearing NAD HEENT: Unremarkable Neck:  No JVD, no thyromegally Lymphatics:  No adenopathy Back:  No CVA tenderness Lungs:  Clear HEART:  Regular rate rhythm, no murmurs, no rubs, no clicks Abd:  soft, positive bowel sounds, no organomegally, no rebound, no guarding Ext:  2 plus pulses, no edema, no cyanosis, no clubbing Skin:  No rashes no nodules Neuro:  CN II through XII intact, motor grossly intact  EKG  DEVICE  Normal device function.  See PaceArt for details.   Assess/Plan:

## 2014-07-16 NOTE — Assessment & Plan Note (Signed)
I discussed the benign nature of her palpitations and recommended she continue on metoprolol, particularly in that she also had a bit of HTN.

## 2014-07-16 NOTE — Patient Instructions (Signed)
Your physician recommends that you schedule a follow-up appointment as needed  

## 2015-01-05 ENCOUNTER — Other Ambulatory Visit: Payer: Self-pay | Admitting: Internal Medicine

## 2015-01-07 ENCOUNTER — Other Ambulatory Visit: Payer: Self-pay

## 2015-01-07 ENCOUNTER — Other Ambulatory Visit: Payer: Self-pay | Admitting: Internal Medicine

## 2015-01-07 MED ORDER — METOPROLOL SUCCINATE ER 25 MG PO TB24: 25.0000 mg | ORAL_TABLET | Freq: Every day | ORAL | Status: DC

## 2015-12-31 ENCOUNTER — Encounter: Payer: Self-pay | Admitting: Gastroenterology

## 2016-01-11 ENCOUNTER — Emergency Department (HOSPITAL_COMMUNITY)
Admission: EM | Admit: 2016-01-11 | Discharge: 2016-01-11 | Disposition: A | Discharge status: Home / Self Care | Payer: BC Managed Care – PPO | Attending: Emergency Medicine | Admitting: Emergency Medicine

## 2016-01-11 ENCOUNTER — Emergency Department (HOSPITAL_COMMUNITY): Payer: BC Managed Care – PPO

## 2016-01-11 ENCOUNTER — Encounter (HOSPITAL_COMMUNITY): Payer: Self-pay | Admitting: Emergency Medicine

## 2016-01-11 DIAGNOSIS — Z91040 Latex allergy status: Secondary | ICD-10-CM | POA: Diagnosis not present

## 2016-01-11 DIAGNOSIS — E039 Hypothyroidism, unspecified: Secondary | ICD-10-CM | POA: Diagnosis not present

## 2016-01-11 DIAGNOSIS — I480 Paroxysmal atrial fibrillation: Secondary | ICD-10-CM | POA: Diagnosis not present

## 2016-01-11 DIAGNOSIS — Z79899 Other long term (current) drug therapy: Secondary | ICD-10-CM | POA: Insufficient documentation

## 2016-01-11 DIAGNOSIS — Z7982 Long term (current) use of aspirin: Secondary | ICD-10-CM | POA: Diagnosis not present

## 2016-01-11 DIAGNOSIS — E876 Hypokalemia: Secondary | ICD-10-CM | POA: Insufficient documentation

## 2016-01-11 DIAGNOSIS — R079 Chest pain, unspecified: Secondary | ICD-10-CM | POA: Diagnosis present

## 2016-01-11 DIAGNOSIS — Z7984 Long term (current) use of oral hypoglycemic drugs: Secondary | ICD-10-CM | POA: Insufficient documentation

## 2016-01-11 LAB — BASIC METABOLIC PANEL
ANION GAP: 13 (ref 5–15)
BUN: 18 mg/dL (ref 6–20)
CALCIUM: 11 mg/dL — AB (ref 8.9–10.3)
CHLORIDE: 99 mmol/L — AB (ref 101–111)
CO2: 25 mmol/L (ref 22–32)
Creatinine, Ser: 0.92 mg/dL (ref 0.44–1.00)
GFR calc non Af Amer: >60 mL/min (ref >60)
GLUCOSE: 144 mg/dL — AB (ref 65–99)
Potassium: 3.3 mmol/L — ABNORMAL LOW (ref 3.5–5.1)
Sodium: 137 mmol/L (ref 135–145)

## 2016-01-11 LAB — CBC
HEMATOCRIT: 47.8 % — AB (ref 36.0–46.0)
HEMOGLOBIN: 16.2 g/dL — AB (ref 12.0–15.0)
MCH: 30.6 pg (ref 26.0–34.0)
MCHC: 33.9 g/dL (ref 30.0–36.0)
MCV: 90.2 fL (ref 78.0–100.0)
Platelets: 289 10*3/uL (ref 150–400)
RBC: 5.3 MIL/uL — AB (ref 3.87–5.11)
RDW: 12.8 % (ref 11.5–15.5)
WBC: 9.6 10*3/uL (ref 4.0–10.5)

## 2016-01-11 LAB — I-STAT TROPONIN, ED: TROPONIN I, POC: 0 ng/mL (ref 0.00–0.08)

## 2016-01-11 MED ORDER — POTASSIUM CHLORIDE CRYS ER 20 MEQ PO TBCR
40.0000 meq | EXTENDED_RELEASE_TABLET | Freq: Once | ORAL | Status: AC
  Administered 2016-01-11: 40 meq via ORAL
  Filled 2016-01-11: qty 2

## 2016-01-11 MED ORDER — DILTIAZEM LOAD VIA INFUSION
15.0000 mg | Freq: Once | INTRAVENOUS | Status: DC
  Filled 2016-01-11: qty 15

## 2016-01-11 MED ORDER — APIXABAN 5 MG PO TABS: 5.0000 mg | ORAL_TABLET | Freq: Two times a day (BID) | ORAL | 0 refills | Status: DC

## 2016-01-11 MED ORDER — FLECAINIDE ACETATE 100 MG PO TABS
300.0000 mg | ORAL_TABLET | Freq: Once | ORAL | Status: DC
  Filled 2016-01-11: qty 3

## 2016-01-11 MED ORDER — APIXABAN 5 MG PO TABS
5.0000 mg | ORAL_TABLET | Freq: Two times a day (BID) | ORAL | Status: DC
  Administered 2016-01-11: 5 mg via ORAL
  Filled 2016-01-11: qty 1

## 2016-01-11 MED ORDER — DILTIAZEM HCL 100 MG IV SOLR
5.0000 mg/h | INTRAVENOUS | Status: DC
  Filled 2016-01-11: qty 100

## 2016-01-11 NOTE — ED Notes (Signed)
Pt converted back into NSR prior to medication administration. MD aware.

## 2016-01-11 NOTE — ED Provider Notes (Signed)
[facility] DEPT Provider Note   CSN: [id number] Arrival date & time: 01/11/16  0132   First MD Initiated Contact with Patient 01/11/16 0224     By signing my name below, I, Claudia Shelton, attest that this documentation has been prepared under the direction and in the presence of Claudia Fuel, MD.  Electronically Signed: [name]. Sheppard Coil, ED Scribe. 01/11/16. 2:47 AM.  History   Chief Complaint Chief Complaint  Patient presents with  . Chest Pain    HPI HPI Comments: Claudia Shelton is a 55 y.o. female with PMHx of palpitations, who presents to the Emergency Department complaining of sudden onset, constant, chest pain x 2 hrs PTA. Pt has associated heart palpitations, shortness of breath, diaphoresis and nausea. Chest pain is exacerbated while lying supine. However, pain is mild relieved when lying semi-fowler's. She reports that pain has mildly improved while in [facility]. Pt had a heart catheterization procedure in 2015. Denies any vomiting or cough.  The history is provided by the patient. No language interpreter was used.    Past Medical History:  Diagnosis Date  . BMI 28.0-28.9,adult   . Chest pain on exertion 2015  . Hypothyroidism   . Jaw disease   . Palpitations   . Pleuritic pain    right lung base  . Prediabetes    F/B Dr. [name]  . SOB (shortness of breath) on exertion 2015    Patient Active Problem List   Diagnosis Date Noted  . Abnormal stress test   . Pain in the chest   . Palpitations 04/30/2014  . Chest pain 11/21/2009  . HYPOTHYROIDISM 11/20/2009    Past Surgical History:  Procedure Laterality Date  . CERVICAL DISCECTOMY    . CESAREAN SECTION     x2  . COMPLETE HYSTERECTOMY     Not due to cancer. WITH OVARIES SPARED  . LEFT HEART CATHETERIZATION WITH CORONARY ANGIOGRAM N/A 06/12/2014   Procedure: LEFT HEART CATHETERIZATION WITH CORONARY ANGIOGRAM;  Surgeon: [name], MD;  Location: [facility] CATH LAB;  Service: Cardiovascular;   Laterality: N/A;  . THYROIDECTOMY Left    "pre-cancerous cells"    OB History    No data available       Home Medications    Prior to Admission medications   Medication Sig Start Date End Date Taking? Authorizing Provider  alendronate (FOSAMAX) 70 MG tablet Take 70 mg by mouth once a week. Take with a full glass of water on an empty stomach.    Historical Provider, MD  ARMOUR THYROID 60 MG tablet Take 180 mg by mouth daily.  05/20/14   Historical Provider, MD  aspirin 81 MG chewable tablet Chew 81 mg by mouth daily.     Historical Provider, MD  ezetimibe-simvastatin (VYTORIN) 10-20 MG per tablet Take 1 tablet by mouth daily.    Historical Provider, MD  metFORMIN (GLUCOPHAGE-XR) 500 MG 24 hr tablet Take 500 mg by mouth daily. 05/10/14   Historical Provider, MD  metoprolol succinate (TOPROL-XL) 25 MG 24 hr tablet TAKE 1 TABLET BY MOUTH DAILY 01/07/15   [name], MD  Multiple Vitamin (MULTIVITAMIN) capsule Take 1 capsule by mouth daily.    Historical Provider, MD  nitroGLYCERIN (NITROSTAT) 0.4 MG SL tablet Place 1 tablet (0.4 mg total) under the tongue every 5 (five) minutes as needed for chest pain. Patient not taking: Reported on 07/16/2014 06/06/14   [name], MD  Vitamin D, Ergocalciferol, (DRISDOL) 50000 UNITS CAPS capsule Take 50,000  Units by mouth every 7 (seven) days.    Historical Provider, MD    Family History Family History  Problem Relation Age of Onset  . Diabetes Mellitus I Mother   . Hypertension Father   . Hyperlipidemia Father   . Heart attack Paternal Grandfather 46  . Stroke Paternal Aunt 72  . Heart attack Paternal Uncle   . Heart attack Cousin 41    Social History Social History  Substance Use Topics  . Smoking status: Never Smoker  . Smokeless tobacco: Not on file  . Alcohol use No     Allergies   Adhesive [tape] and Latex   Review of Systems Review of Systems  Constitutional: Positive for diaphoresis.  Respiratory: Positive for  shortness of breath. Negative for cough.   Cardiovascular: Positive for chest pain and palpitations.  Gastrointestinal: Positive for nausea. Negative for vomiting.  All other systems reviewed and are negative.  Physical Exam Updated Vital Signs BP (!) 139/107 (BP Location: Left Arm)   Pulse (!) 142   Temp 99.4 F (37.4 C) (Oral)   Resp 16   SpO2 100%   Physical Exam  Constitutional: She is oriented to person, place, and time. She appears well-developed and well-nourished.  HENT:  Head: Normocephalic and atraumatic.  Eyes: EOM are normal. Pupils are equal, round, and reactive to light.  Neck: Normal range of motion. Neck supple. No JVD present.  Cardiovascular: Normal heart sounds.   Tachycardic, irregular heart rate and rhythm.  Pulmonary/Chest: Effort normal and breath sounds normal. She has no wheezes. She has no rales. She exhibits no tenderness.  Abdominal: Soft. She exhibits no distension and no mass. There is no tenderness.  Musculoskeletal: Normal range of motion. She exhibits edema.  Mild pre-tibial edema.  Lymphadenopathy:    She has no cervical adenopathy.  Neurological: She is alert and oriented to person, place, and time. No cranial nerve deficit. She exhibits normal muscle tone. Coordination normal.  Skin: Skin is warm and dry. No rash noted.  Psychiatric: She has a normal mood and affect. Her behavior is normal. Judgment and thought content normal.  Nursing note and vitals reviewed.  ED Treatments / Results  DIAGNOSTIC STUDIES: Oxygen Saturation is 100% on RA, normal by my interpretation.    COORDINATION OF CARE: 2:28 AM-Discussed treatment plan which includes order of Cardizem with pt at bedside and pt agreed to plan.   Labs (all labs ordered are listed, but only abnormal results are displayed) Labs Reviewed  BASIC METABOLIC PANEL - Abnormal; Notable for the following:       Result Value   Potassium 3.3 (*)    Chloride 99 (*)    Glucose, Bld 144 (*)     Calcium 11.0 (*)    All other components within normal limits  CBC - Abnormal; Notable for the following:    RBC 5.30 (*)    Hemoglobin 16.2 (*)    HCT 47.8 (*)    All other components within normal limits  I-STAT TROPOININ, ED    EKG  EKG Interpretation  Date/Time:  Sunday January 11 2016 01:37:03 EDT Ventricular Rate:  143 PR Interval:    QRS Duration: 114 QT Interval:  256 QTC Calculation: 395 R Axis:   45 Text Interpretation:  Atrial flutter with 2:1 A-V conduction Possible Anterior infarct , age undetermined Abnormal ECG When compared with ECG of 06/12/2014, Atrial flutter has replaced Sinus rhythm Confirmed by Roxanne Mins  MD, Nalleli Largent ([id number]) on 01/11/2016 1:44:11 AM  Radiology Dg Chest Port 1 View  Result Date: 01/11/2016 CLINICAL DATA:  Pt states that she began feeling like her heart was beating fast this evening. Pt sates that she has a hx of HTN and diabetes. EXAM: PORTABLE CHEST 1 VIEW COMPARISON:  11/04/2009 FINDINGS: The heart size and mediastinal contours are within normal limits. Both lungs are clear. No pleural effusion or pneumothorax. The visualized skeletal structures are unremarkable. IMPRESSION: No active disease. Electronically Signed   By: Lajean Manes M.D.   On: 01/11/2016 03:22   Procedures Procedures (including critical care time)  Medications Ordered in ED Medications  potassium chloride SA (K-DUR,KLOR-CON) CR tablet 40 mEq (40 mEq Oral Given 01/11/16 0340)   Initial Impression / Assessment and Plan / ED Course  I have reviewed the triage vital signs and the nursing notes.  Pertinent labs & imaging results that were available during my care of the patient were reviewed by me and considered in my medical decision making (see chart for details).  Clinical Course    Paroxysmal atrial fibrillation. ECG looks like atrial flutter, but her cardiac monitor shows atrial fibrillation. Review of old records shows cardiac catheterization in 2015 showing normal  coronary arteries, and elevated end-diastolic pressures. Holter monitor showed non-sustained SVT.  I gave the patient the option of flecainide cardioversion versus electrical cardioversion and she has chosen flecainide. Flecainide was ordered, the patient converted spontaneously before she received any medication. ECG postconversion Shows normal sinus rhythm. She is discharged with prescription for apixaban and is referred back to her cardiologist. Potassium is slightly low, and she is given a dose of K-Dur in the ED.  CHADS-VASC score = 3 (points for females sex, DM, and diastolic dysfunction on cardiac cath).  Final Clinical Impressions(s) / ED Diagnoses   Final diagnoses:  Paroxysmal atrial fibrillation with rapid ventricular response ([place])  Hypokalemia    New Prescriptions Discharge Medication List as of 01/11/2016  3:26 AM    START taking these medications   Details  apixaban (ELIQUIS) 5 MG TABS tablet Take 1 tablet (5 mg total) by mouth 2 (two) times daily., Starting Sun 01/11/2016, Print       I personally performed the services described in this documentation, which was scribed in my presence. The recorded information has been reviewed and is accurate.       Claudia Fuel, MD [id number]

## 2016-01-11 NOTE — ED Triage Notes (Signed)
Pt. woke up with central chest pain /tightness , palpitations  with SOB and nausea this evening , denies diaphoresis , no cough or chest congestion .

## 2016-01-11 NOTE — ED Notes (Signed)
Pt d/c home, family with pt. Pt ambulatory. Instructions and follow up reviewed with pt and pt's husband.

## 2016-01-13 ENCOUNTER — Encounter: Payer: Self-pay | Admitting: Internal Medicine

## 2016-01-13 ENCOUNTER — Ambulatory Visit (INDEPENDENT_AMBULATORY_CARE_PROVIDER_SITE_OTHER): Payer: BC Managed Care – PPO | Admitting: Physician Assistant

## 2016-01-13 VITALS — BP 148/84 | HR 56 | Ht 63.0 in | Wt 166.8 lb

## 2016-01-13 DIAGNOSIS — I10 Essential (primary) hypertension: Secondary | ICD-10-CM

## 2016-01-13 DIAGNOSIS — R002 Palpitations: Secondary | ICD-10-CM | POA: Diagnosis not present

## 2016-01-13 DIAGNOSIS — I4892 Unspecified atrial flutter: Secondary | ICD-10-CM

## 2016-01-13 MED ORDER — METOPROLOL SUCCINATE ER 50 MG PO TB24: 50.0000 mg | ORAL_TABLET | Freq: Every day | ORAL | 5 refills | Status: DC

## 2016-01-13 MED ORDER — APIXABAN 5 MG PO TABS: 5.0000 mg | ORAL_TABLET | Freq: Two times a day (BID) | ORAL | 5 refills | Status: DC

## 2016-01-13 NOTE — Patient Instructions (Addendum)
Medication Instructions:   START METOPROLOL 50 MG ONCE A DAY    If you need a refill on your cardiac medications before your next appointment, please call your pharmacy.  Labwork: BMET AND TSH TODAY    Testing/Procedures: Your physician has requested that you have an echocardiogram. Echocardiography is a painless test that uses sound waves to create images of your heart. It provides your doctor with information about the size and shape of your heart and how well your heart's chambers and valves are working. This procedure takes approximately one hour. There are no restrictions for this procedure.    Follow-Up:  ASAP WITH DR Lovena Le TO DISCUSS ABLATION  Any Other Special Instructions Will Be Listed Below (If Applicable).

## 2016-01-13 NOTE — Progress Notes (Signed)
Cardiology Office Note Date:  01/13/2016  Patient ID:  Claudia, Shelton 12-Feb-1961, MRN [id number] PCP:  Leonides Sake, MD  Cardiologist:  Dr. Lovena Le   Chief Complaint:  ER f/u  History of Present Illness: Claudia Shelton is a 55 y.o. female with history of HL, pre-diabetes, cervical disc disease, hypothyroidism, previous w/u for palpitations in 2016 with 48hr monitor noted only PVC's, NSVT (less then 5 seconds), last seen by Dr. Penny Pia 2016.  She had c/o CP in 2015 underwent cath with normal coronaries.  She was seen at [facility] 01/11/16, with c/o sudden onset of palpitations and CP, noted in AFlutter with RVR, spontaneously Cv to SR, and started on Eliquis for, discharged out patient f/u.  The patient states for a few years she has almost daily very quick heart beats that last only a second or two, once when she first came to be seen, lasted 5 minutes.  This event woke her from sleep and persisted for hours, she felt her heart racing, eventually heaviness in the chest and central pain, intermittently as it lasted longer felt SOB, diaphoretic and lightheaded, no near syncope or syncope, nauseous but no vomiting.  She noted her BP 190/? At home, took 2 ASA and her husband brought her to the ER.   They discussed DCCV or Flecainide to try and restore SR, though she converted spontaneously and immediately felt better.  She has had the same second or two lasting palpitations since then, nothing longer lasting.  She was rx Eliquis, though not started it.  Her trop 0.00, K+ 3.3 and was given single PO dose, creat 0/92, H/H 16/47.  She has felt well since with ongoing very fleeting palpitations, no further chest heaviness or pain, no dizziness, near syncope or syncope, no SOB.   Past Medical History:  Diagnosis Date  . BMI 28.0-28.9,adult   . Chest pain on exertion 2015  . Hypothyroidism   . Jaw disease   . Palpitations   . Pleuritic pain    right lung base  . Prediabetes    F/B Dr. [name]    . SOB (shortness of breath) on exertion 2015    Past Surgical History:  Procedure Laterality Date  . CERVICAL DISCECTOMY    . CESAREAN SECTION     x2  . COMPLETE HYSTERECTOMY     Not due to cancer. WITH OVARIES SPARED  . LEFT HEART CATHETERIZATION WITH CORONARY ANGIOGRAM N/A 06/12/2014   Procedure: LEFT HEART CATHETERIZATION WITH CORONARY ANGIOGRAM;  Surgeon: [name], MD;  Location: [facility] CATH LAB;  Service: Cardiovascular;  Laterality: N/A;  . THYROIDECTOMY Left    "pre-cancerous cells"    Current Outpatient Prescriptions  Medication Sig Dispense Refill  . alendronate (FOSAMAX) 70 MG tablet Take 70 mg by mouth once a week. Take with a full glass of water on an empty stomach.    Marland Kitchen apixaban (ELIQUIS) 5 MG TABS tablet Take 1 tablet (5 mg total) by mouth 2 (two) times daily. 30 tablet 0  . ARMOUR THYROID 60 MG tablet Take 60-120 mg by mouth See admin instructions. Alternate taking 1 tablet one day then take 2 tablets the next day    . aspirin 81 MG chewable tablet Chew 81 mg by mouth daily.     . calcium carbonate (OS-CAL - DOSED IN MG OF ELEMENTAL CALCIUM) 1250 (500 Ca) MG tablet Take 2 tablets by mouth daily with breakfast.    . ezetimibe-simvastatin (VYTORIN) 10-20 MG per tablet  Take 1 tablet by mouth daily.    . metFORMIN (GLUCOPHAGE-XR) 500 MG 24 hr tablet Take 500 mg by mouth daily.    . metoprolol succinate (TOPROL-XL) 25 MG 24 hr tablet TAKE 1 TABLET BY MOUTH DAILY 90 tablet 2  . Multiple Vitamin (MULTIVITAMIN WITH MINERALS) TABS tablet Take 1 tablet by mouth daily.    . nitroGLYCERIN (NITROSTAT) 0.4 MG SL tablet Place 1 tablet (0.4 mg total) under the tongue every 5 (five) minutes as needed for chest pain. 25 tablet 3   No current facility-administered medications for this visit.     Allergies:   Adhesive [tape] and Latex   Social History:  The patient  reports that she has never smoked. She has never used smokeless tobacco. She reports that she does not drink alcohol  or use drugs.   Family History:  The patient's family history includes Diabetes Mellitus I in her mother; Heart attack in her paternal uncle; Heart attack (age of onset: 29) in her cousin; Heart attack (age of onset: 10) in her paternal grandfather; Hyperlipidemia in her father; Hypertension in her father; Stroke (age of onset: 95) in her paternal aunt.  ROS:  Please see the history of present illness.  All other systems are reviewed and otherwise negative.   PHYSICAL EXAM:  VS:  BP (!) 148/84   Pulse (!) 56   Ht 5\' 3"  (1.6 m)   Wt 166 lb 12.8 oz (75.7 kg)   SpO2 98%   BMI 29.55 kg/m  BMI: Body mass index is 29.55 kg/m. Well nourished, well developed, in no acute distress  HEENT: normocephalic, atraumatic  Neck: no JVD, carotid bruits or masses Cardiac:  RRR; no significant murmurs, no rubs, or gallops Lungs:  clear to auscultation bilaterally, no wheezing, rhonchi or rales  Abd: soft, nontender MS: no deformity or atrophy Ext: no edema  Skin: warm and dry, no rash Neuro:  No gross deficits appreciated Psych: euthymic mood, full affect   EKG:  Done today shows SB, 56bpm ER visit EKGs 01/11/16 presenting EKG is AFlutter, appesrs typical, 142bpm F/u same day SR, 89bp  06/12/14: [facility] IMPRESSIONS:  1. Normal coronary arteries 2. Hyperdynamic left ventricular systolic function. Chronic diastolic heart failure with elevated end-diastolic pressures. 3. Essential hypertension  Recent Labs: 01/11/2016: BUN 18; Creatinine, Ser 0.92; Hemoglobin 16.2; Platelets 289; Potassium 3.3; Sodium 137  No results found for requested labs within last 8760 hours.   Estimated Creatinine Clearance: 66.5 mL/min (by C-G formula based on SCr of 0.92 mg/dL).   Wt Readings from Last 3 Encounters:  01/13/16 166 lb 12.8 oz (75.7 kg)  07/16/14 160 lb 3.2 oz (72.7 kg)  06/12/14 168 lb (76.2 kg)     Other studies reviewed: Additional studies/records reviewed today include: summarized above  ASSESSMENT  AND PLAN:  1. New PAFlutter w/RVR     Palpitations     Appears typical     Discussed CHADS2Vasc score, 2 for female/HTN, possibly 3 with pre-DM (?DM)     Discussed risk/benefit of full a/c and need for a/c prior to possible ablation The patient understands and is agreeable to begin Eliquis 5mg  BID, discussed bleeding precautions, signs of bleeding, bleeding to report.  She has not had any recent surgeries, no hematological history and no known bleeding history  Discussed EPS/Ablation procedure as a possible option for her AFlutter, will need review with Dr. Lovena Le she is agreeable and would like to discuss with dr. Lovena Le, this as an option for her.  Brief palpitations continue, especially since this episode are anxiety provoking, will go ahead and increase her Metoprolol to 50mg  daily, discussed if she developes any fatigue or symptoms of brady to resume her 25mg  dose.  2. HTN  Disposition: TSH, BMET today to recheck her K+, will schedule an echo and visit with Dr. Lovena Le as soon as his schedule allows to discuss possible ablation procedure.  Current medicines are reviewed at length with the patient today.  The patient did not have any concerns regarding medicines.  Haywood Lasso, PA-C 01/13/2016 11:58 AM     [facility] [place] [address] [phone] (office)  [phone] (fax)

## 2016-01-13 NOTE — Progress Notes (Signed)
Pt seeing Claudia Shelton, [place] today 01/13/16

## 2016-01-14 ENCOUNTER — Encounter: Payer: Self-pay | Admitting: Physician Assistant

## 2016-01-14 LAB — BASIC METABOLIC PANEL
BUN: 13 mg/dL (ref 7–25)
CO2: 26 mmol/L (ref 20–31)
Calcium: 10.3 mg/dL (ref 8.6–10.4)
Chloride: 101 mmol/L (ref 98–110)
Creat: 0.81 mg/dL (ref 0.50–1.05)
GLUCOSE: 88 mg/dL (ref 65–99)
POTASSIUM: 4.3 mmol/L (ref 3.5–5.3)
SODIUM: 137 mmol/L (ref 135–146)

## 2016-01-14 LAB — TSH: TSH: 5.2 m[IU]/L — AB

## 2016-01-16 ENCOUNTER — Telehealth: Payer: Self-pay | Admitting: *Deleted

## 2016-01-16 NOTE — Telephone Encounter (Signed)
-----   Message from [place], [place] sent at 01/14/2016  7:42 AM EDT ----- Can you please let the patient know her potassium s OK, her TSH (thyroid) result is a little high, if you can get her endocrinologist information and forward to him and her PMD, for her to follow up for any adjustment in her replacement if needed.  I don't believe this is contributing to her tachycardia/arrhythmia.  Thanks [employer]

## 2016-01-16 NOTE — Telephone Encounter (Signed)
SPOKE TO PT ABOUT RESULTS AND PER PROVIDER ADVICE ELEVATED TSH IS NOT CONTRIBUTING FACTOR TO TACHYCARDIA  ALSO THAT NOTES WILL BE ROUTED TO PROVIDERS IN CASE OF ANY REPLACEMENTS MAY BE [place]

## 2016-01-22 ENCOUNTER — Other Ambulatory Visit: Payer: Self-pay

## 2016-01-22 ENCOUNTER — Ambulatory Visit (HOSPITAL_COMMUNITY): Payer: BC Managed Care – PPO | Attending: Cardiovascular Disease

## 2016-01-22 DIAGNOSIS — I4892 Unspecified atrial flutter: Secondary | ICD-10-CM

## 2016-01-22 DIAGNOSIS — I351 Nonrheumatic aortic (valve) insufficiency: Secondary | ICD-10-CM | POA: Diagnosis not present

## 2016-01-23 ENCOUNTER — Telehealth: Payer: Self-pay | Admitting: *Deleted

## 2016-01-23 NOTE — Telephone Encounter (Signed)
-----   Message from [facility], [place] sent at 01/22/2016  5:01 PM EDT ----- Please let the patient know her echo looked good.  Normal heart muscle strength.  Thanks [employer]

## 2016-01-23 NOTE — Addendum Note (Signed)
Addended by: Freada Bergeron on: 01/23/2016 04:58 PM   Modules accepted: Orders

## 2016-01-23 NOTE — Telephone Encounter (Signed)
LM WITH SON TO CONTACT OFFICE BACK FOR RESULTS IF UNABLE TO CONTACT BACK TODAY WILL CONTACT Monday MORNING

## 2016-01-26 ENCOUNTER — Telehealth: Payer: Self-pay | Admitting: *Deleted

## 2016-01-26 NOTE — Telephone Encounter (Signed)
SPOKE TO PT ABOUT RESULTS AND REVERIFIED APPT WITH DR Lovena Le ON 01/28/2016@4 :15 ALSO A NEW CONTACT NUMBER WAS ADDED FOR PT

## 2016-01-26 NOTE — Telephone Encounter (Signed)
-----   Message from [facility], [place] sent at 01/22/2016  5:01 PM EDT ----- Please let the patient know her echo looked good.  Normal heart muscle strength.  Thanks [employer]

## 2016-01-28 ENCOUNTER — Encounter: Payer: Self-pay | Admitting: Internal Medicine

## 2016-01-28 ENCOUNTER — Encounter (INDEPENDENT_AMBULATORY_CARE_PROVIDER_SITE_OTHER): Payer: Self-pay

## 2016-01-28 ENCOUNTER — Ambulatory Visit (INDEPENDENT_AMBULATORY_CARE_PROVIDER_SITE_OTHER): Payer: BC Managed Care – PPO | Admitting: Internal Medicine

## 2016-01-28 VITALS — BP 138/68 | HR 61 | Ht 64.0 in | Wt 168.6 lb

## 2016-01-28 DIAGNOSIS — I4892 Unspecified atrial flutter: Secondary | ICD-10-CM

## 2016-01-28 NOTE — Progress Notes (Signed)
HPI Claudia Shelton returns today for followup. I had seen her several months ago with palpitations and chest pressure. Subsequent evaluation with a 48 hour holter demonstrated rare PVC's and NS SVT (less than 5 seconds) and she had an abnormal stress test and underwent catheterization which demonstrated normal LV function and no CAD. In the interim she has developed atrial flutter with an RVR for which she sought medical attention. The episode lasted about 2hours and she was placed on anti-coagulation.   Allergies  Allergen Reactions  . Adhesive [Tape] Hives and Itching  . Latex Hives     Current Outpatient Prescriptions  Medication Sig Dispense Refill  . alendronate (FOSAMAX) 70 MG tablet Take 70 mg by mouth once a week. Take with a full glass of water on an empty stomach.    Marland Kitchen apixaban (ELIQUIS) 5 MG TABS tablet Take 1 tablet (5 mg total) by mouth 2 (two) times daily. 60 tablet 5  . ARMOUR THYROID 60 MG tablet Take 60-120 mg by mouth See admin instructions. Alternate taking 1 tablet one day then take 2 tablets the next day    . aspirin 81 MG chewable tablet Chew 81 mg by mouth daily.     . calcium carbonate (OS-CAL - DOSED IN MG OF ELEMENTAL CALCIUM) 1250 (500 Ca) MG tablet Take 2 tablets by mouth daily with breakfast.    . ezetimibe-simvastatin (VYTORIN) 10-20 MG per tablet Take 1 tablet by mouth daily.    . metFORMIN (GLUCOPHAGE-XR) 500 MG 24 hr tablet Take 500 mg by mouth daily.    . metoprolol succinate (TOPROL-XL) 50 MG 24 hr tablet Take 1 tablet (50 mg total) by mouth daily. 30 tablet 5  . Multiple Vitamin (MULTIVITAMIN WITH MINERALS) TABS tablet Take 1 tablet by mouth daily.    . Vitamin D, Ergocalciferol, (DRISDOL) 50000 units CAPS capsule Take 1 capsule by mouth once a week.  5   No current facility-administered medications for this visit.      Past Medical History:  Diagnosis Date  . BMI 28.0-28.9,adult   . Chest pain on exertion 2015  . Hypothyroidism   . Jaw disease    . Palpitations   . Pleuritic pain    right lung base  . Prediabetes    F/B Dr. [name]  . SOB (shortness of breath) on exertion 2015    ROS:   All systems reviewed and negative except as noted in the HPI.   Past Surgical History:  Procedure Laterality Date  . CERVICAL DISCECTOMY    . CESAREAN SECTION     x2  . COMPLETE HYSTERECTOMY     Not due to cancer. WITH OVARIES SPARED  . LEFT HEART CATHETERIZATION WITH CORONARY ANGIOGRAM N/A 06/12/2014   Procedure: LEFT HEART CATHETERIZATION WITH CORONARY ANGIOGRAM;  Surgeon: [name], MD;  Location: [facility] CATH LAB;  Service: Cardiovascular;  Laterality: N/A;  . THYROIDECTOMY Left    "pre-cancerous cells"     Family History  Problem Relation Age of Onset  . Diabetes Mellitus I Mother   . Hypertension Father   . Hyperlipidemia Father   . Heart attack Paternal Grandfather 63  . Stroke Paternal Aunt 57  . Heart attack Paternal Uncle   . Heart attack Cousin 30     Social History   Social History  . Marital status: Married    Spouse name: N/A  . Number of children: N/A  . Years of education: N/A   Occupational History  .  Not on file.   Social History Main Topics  . Smoking status: Never Smoker  . Smokeless tobacco: Never Used  . Alcohol use No  . Drug use: No  . Sexual activity: Yes    Birth control/ protection: None, Surgical     Comment: partial hysterctomy   Other Topics Concern  . Not on file   Social History Narrative  . No narrative on file     BP 138/68   Pulse 61   Ht 5\' 4"  (1.626 m)   Wt 168 lb 9.6 oz (76.5 kg)   BMI 28.94 kg/m   Physical Exam:  Well appearing middle aged woman, NAD HEENT: Unremarkable Neck:  No JVD, no thyromegally Lymphatics:  No adenopathy Back:  No CVA tenderness Lungs:  Clear with no wheezes HEART:  Regular rate rhythm, no murmurs, no rubs, no clicks Abd:  soft, positive bowel sounds, no organomegally, no rebound, no guarding Ext:  2 plus pulses, no edema, no  cyanosis, no clubbing Skin:  No rashes no nodules Neuro:  CN II through XII intact, motor grossly intact  EKG - NSR  Assess/Plan: 1. Atrial flutter - I have discussed the treatment options with the patient in detail. She would like to proceed with catheter ablation. The risks/benefits/goals/expectations of ablation were discussed and she wishes to proceed. 2. coags - her CHADSVASC score is 3. She will continue Eliquis, holding her dose on the morning of the procedure. 3. HTN - her blood pressure is reasonably well controlled. Will follow.  Mikle Bosworth.D.

## 2016-01-28 NOTE — Patient Instructions (Signed)
Medication Instructions: Your physician recommends that you continue on your current medications as directed. Please refer to the Current Medication list given to you today.   Labwork: NONE ORDERED  Procedures/Testing: Your physician has recommended that you have an ablation. Catheter ablation is a medical procedure used to treat some cardiac arrhythmias (irregular heartbeats). During catheter ablation, a long, thin, flexible tube is put into a blood vessel in your groin (upper thigh), or neck. This tube is called an ablation catheter. It is then guided to your heart through the blood vessel. Radio frequency waves destroy small areas of heart tissue where abnormal heartbeats may cause an arrhythmia to start.   The available dates for you will be : 8/28, 8/30, 9/13, and 9/20. Ideally you would be the first case of the morning which means you will arrive at the [facility] at 5:30 am for a 7:30 am procedure. Please call our office to set up arrangements if you want to proceed.    Follow-Up: Your physician recommends that you schedule a follow-up appointment in 10 weeks with Dr. Lovena Le.  Appointment is subject to change based on ablation.    Any Additional Special Instructions Will Be Listed Below (If Applicable).     If you need a refill on your cardiac medications before your next appointment, please call your pharmacy.

## 2016-01-29 ENCOUNTER — Telehealth: Payer: Self-pay | Admitting: Internal Medicine

## 2016-01-29 DIAGNOSIS — I4892 Unspecified atrial flutter: Secondary | ICD-10-CM

## 2016-01-29 NOTE — Telephone Encounter (Signed)
Labs are scheduled for tomorrow and ablation for 8/30.  Patient aware of time to arrive at the [facility]

## 2016-01-29 NOTE — Telephone Encounter (Signed)
New message    Patient wants to do procedure on  8/30.

## 2016-01-29 NOTE — Telephone Encounter (Signed)
New Message  Pt voiced she'd prefer to come in tomorrow morning around 730 or 8 for labs.  No order displayed for labs for pt.  Please follow up with pt if needed. Thanks!

## 2016-01-29 NOTE — Telephone Encounter (Signed)
I have scheduled for 8/30 at 8:30am  She will need to be at the hospital at 6:30am.  Will have her coming for labs first of week.  I have left her a voicemail ion regards to this and to call back to schedule lab time

## 2016-01-30 ENCOUNTER — Encounter (INDEPENDENT_AMBULATORY_CARE_PROVIDER_SITE_OTHER): Payer: Self-pay

## 2016-01-30 ENCOUNTER — Other Ambulatory Visit: Payer: BC Managed Care – PPO | Admitting: *Deleted

## 2016-01-30 DIAGNOSIS — I4892 Unspecified atrial flutter: Secondary | ICD-10-CM

## 2016-01-30 LAB — CBC WITH DIFFERENTIAL/PLATELET
Basophils Absolute: 59 cells/uL (ref 0–200)
Basophils Relative: 1 %
EOS PCT: 3 %
Eosinophils Absolute: 177 cells/uL (ref 15–500)
HCT: 42.9 % (ref 35.0–45.0)
Hemoglobin: 14.6 g/dL (ref 11.7–15.5)
LYMPHS PCT: 25 %
Lymphs Abs: 1475 cells/uL (ref 850–3900)
MCH: 30 pg (ref 27.0–33.0)
MCHC: 34 g/dL (ref 32.0–36.0)
MCV: 88.3 fL (ref 80.0–100.0)
MONOS PCT: 7 %
MPV: 10.2 fL (ref 7.5–12.5)
Monocytes Absolute: 413 cells/uL (ref 200–950)
Neutro Abs: 3776 cells/uL (ref 1500–7800)
Neutrophils Relative %: 64 %
PLATELETS: 242 10*3/uL (ref 140–400)
RBC: 4.86 MIL/uL (ref 3.80–5.10)
RDW: 13.2 % (ref 11.0–15.0)
WBC: 5.9 10*3/uL (ref 3.8–10.8)

## 2016-01-30 LAB — BASIC METABOLIC PANEL
BUN: 16 mg/dL (ref 7–25)
CALCIUM: 9.6 mg/dL (ref 8.6–10.4)
CHLORIDE: 102 mmol/L (ref 98–110)
CO2: 24 mmol/L (ref 20–31)
CREATININE: 0.81 mg/dL (ref 0.50–1.05)
Glucose, Bld: 106 mg/dL — ABNORMAL HIGH (ref 65–99)
Potassium: 4.1 mmol/L (ref 3.5–5.3)
Sodium: 139 mmol/L (ref 135–146)

## 2016-02-11 ENCOUNTER — Ambulatory Visit (HOSPITAL_COMMUNITY)
Admission: RE | Admit: 2016-02-11 | Discharge: 2016-02-11 | Disposition: A | Discharge status: Home / Self Care | Payer: BC Managed Care – PPO | Source: Ambulatory Visit | Attending: Internal Medicine | Admitting: Internal Medicine

## 2016-02-11 ENCOUNTER — Encounter (HOSPITAL_COMMUNITY)
Admission: RE | Disposition: A | Discharge status: Home / Self Care | Payer: Self-pay | Source: Ambulatory Visit | Attending: Internal Medicine

## 2016-02-11 ENCOUNTER — Encounter (HOSPITAL_COMMUNITY): Payer: Self-pay | Admitting: Internal Medicine

## 2016-02-11 DIAGNOSIS — Z7901 Long term (current) use of anticoagulants: Secondary | ICD-10-CM | POA: Diagnosis not present

## 2016-02-11 DIAGNOSIS — R7303 Prediabetes: Secondary | ICD-10-CM | POA: Diagnosis not present

## 2016-02-11 DIAGNOSIS — Z90710 Acquired absence of both cervix and uterus: Secondary | ICD-10-CM | POA: Diagnosis not present

## 2016-02-11 DIAGNOSIS — I483 Typical atrial flutter: Secondary | ICD-10-CM | POA: Insufficient documentation

## 2016-02-11 DIAGNOSIS — Z7984 Long term (current) use of oral hypoglycemic drugs: Secondary | ICD-10-CM | POA: Diagnosis not present

## 2016-02-11 DIAGNOSIS — Z7982 Long term (current) use of aspirin: Secondary | ICD-10-CM | POA: Insufficient documentation

## 2016-02-11 DIAGNOSIS — I4891 Unspecified atrial fibrillation: Secondary | ICD-10-CM | POA: Insufficient documentation

## 2016-02-11 DIAGNOSIS — E039 Hypothyroidism, unspecified: Secondary | ICD-10-CM | POA: Diagnosis not present

## 2016-02-11 DIAGNOSIS — I4892 Unspecified atrial flutter: Secondary | ICD-10-CM | POA: Diagnosis not present

## 2016-02-11 HISTORY — DX: Other specified postprocedural states: Z98.890

## 2016-02-11 HISTORY — DX: Essential (primary) hypertension: I10

## 2016-02-11 HISTORY — PX: ELECTROPHYSIOLOGIC STUDY: SHX172A

## 2016-02-11 HISTORY — DX: Type 2 diabetes mellitus without complications: E11.9

## 2016-02-11 HISTORY — DX: Nausea with vomiting, unspecified: R11.2

## 2016-02-11 HISTORY — DX: Dislocation of jaw, unspecified side, initial encounter: S03.00XA

## 2016-02-11 LAB — GLUCOSE, CAPILLARY
GLUCOSE-CAPILLARY: 148 mg/dL — AB (ref 65–99)
Glucose-Capillary: 129 mg/dL — ABNORMAL HIGH (ref 65–99)

## 2016-02-11 SURGERY — A-FLUTTER/A-TACH/SVT ABLATION
Anesthesia: LOCAL

## 2016-02-11 MED ORDER — CALCIUM CARBONATE 1250 (500 CA) MG PO TABS: 2.0000 | ORAL_TABLET | Freq: Every day | ORAL | Status: DC

## 2016-02-11 MED ORDER — FENTANYL CITRATE (PF) 100 MCG/2ML IJ SOLN
INTRAMUSCULAR | Status: AC
  Filled 2016-02-11: qty 2

## 2016-02-11 MED ORDER — LIDOCAINE HCL (PF) 1 % IJ SOLN: INTRAMUSCULAR | Status: DC | PRN

## 2016-02-11 MED ORDER — BUPIVACAINE HCL (PF) 0.25 % IJ SOLN
INTRAMUSCULAR | Status: DC | PRN
  Administered 2016-02-11: 31 mL

## 2016-02-11 MED ORDER — METFORMIN HCL ER 500 MG PO TB24: 500.0000 mg | ORAL_TABLET | Freq: Every day | ORAL | Status: DC

## 2016-02-11 MED ORDER — MIDAZOLAM HCL 5 MG/5ML IJ SOLN
INTRAMUSCULAR | Status: AC
Start: 2016-02-11 — End: 2016-02-11
  Filled 2016-02-11: qty 5

## 2016-02-11 MED ORDER — THYROID 60 MG PO TABS: 120.0000 mg | ORAL_TABLET | ORAL | Status: DC

## 2016-02-11 MED ORDER — VITAMIN D (ERGOCALCIFEROL) 1.25 MG (50000 UNIT) PO CAPS: 50000.0000 [IU] | ORAL_CAPSULE | ORAL | Status: DC

## 2016-02-11 MED ORDER — FENTANYL CITRATE (PF) 100 MCG/2ML IJ SOLN
INTRAMUSCULAR | Status: DC | PRN
  Administered 2016-02-11 (×4): 25 ug via INTRAVENOUS
  Administered 2016-02-11: 12.5 ug via INTRAVENOUS
  Administered 2016-02-11 (×2): 25 ug via INTRAVENOUS
  Administered 2016-02-11: 12.5 ug via INTRAVENOUS
  Administered 2016-02-11 (×3): 25 ug via INTRAVENOUS

## 2016-02-11 MED ORDER — MIDAZOLAM HCL 5 MG/5ML IJ SOLN
INTRAMUSCULAR | Status: AC
  Filled 2016-02-11: qty 5

## 2016-02-11 MED ORDER — THYROID 60 MG PO TABS: 60.0000 mg | ORAL_TABLET | ORAL | Status: DC

## 2016-02-11 MED ORDER — ONDANSETRON HCL 4 MG/2ML IJ SOLN: 4.0000 mg | Freq: Four times a day (QID) | INTRAMUSCULAR | Status: DC | PRN

## 2016-02-11 MED ORDER — APIXABAN 5 MG PO TABS: 5.0000 mg | ORAL_TABLET | Freq: Two times a day (BID) | ORAL | Status: DC

## 2016-02-11 MED ORDER — SODIUM CHLORIDE 0.9% FLUSH: 3.0000 mL | INTRAVENOUS | Status: DC | PRN

## 2016-02-11 MED ORDER — ACETAMINOPHEN 325 MG PO TABS: 650.0000 mg | ORAL_TABLET | ORAL | Status: DC | PRN

## 2016-02-11 MED ORDER — BUPIVACAINE HCL (PF) 0.25 % IJ SOLN
INTRAMUSCULAR | Status: AC
  Filled 2016-02-11: qty 30

## 2016-02-11 MED ORDER — ALENDRONATE SODIUM 70 MG PO TABS: 70.0000 mg | ORAL_TABLET | ORAL | Status: DC

## 2016-02-11 MED ORDER — EZETIMIBE-SIMVASTATIN 10-20 MG PO TABS
1.0000 | ORAL_TABLET | Freq: Every day | ORAL | Status: DC
  Filled 2016-02-11: qty 1

## 2016-02-11 MED ORDER — HEPARIN (PORCINE) IN NACL 2-0.9 UNIT/ML-% IJ SOLN
INTRAMUSCULAR | Status: DC | PRN
  Administered 2016-02-11: 09:00:00

## 2016-02-11 MED ORDER — SODIUM CHLORIDE 0.9% FLUSH: 3.0000 mL | Freq: Two times a day (BID) | INTRAVENOUS | Status: DC

## 2016-02-11 MED ORDER — SODIUM CHLORIDE 0.9 % IV SOLN
INTRAVENOUS | Status: DC
  Administered 2016-02-11: 07:00:00 via INTRAVENOUS

## 2016-02-11 MED ORDER — METOPROLOL SUCCINATE ER 50 MG PO TB24: 50.0000 mg | ORAL_TABLET | Freq: Every day | ORAL | Status: DC

## 2016-02-11 MED ORDER — MIDAZOLAM HCL 5 MG/5ML IJ SOLN
INTRAMUSCULAR | Status: DC | PRN
  Administered 2016-02-11 (×3): 2 mg via INTRAVENOUS
  Administered 2016-02-11: 1 mg via INTRAVENOUS
  Administered 2016-02-11: 2 mg via INTRAVENOUS
  Administered 2016-02-11 (×2): 1 mg via INTRAVENOUS
  Administered 2016-02-11 (×2): 2 mg via INTRAVENOUS
  Administered 2016-02-11: 1 mg via INTRAVENOUS
  Administered 2016-02-11: 2 mg via INTRAVENOUS

## 2016-02-11 MED ORDER — SODIUM CHLORIDE 0.9 % IV SOLN: 250.0000 mL | INTRAVENOUS | Status: DC | PRN

## 2016-02-11 MED ORDER — HEPARIN (PORCINE) IN NACL 2-0.9 UNIT/ML-% IJ SOLN
INTRAMUSCULAR | Status: AC
  Filled 2016-02-11: qty 500

## 2016-02-11 SURGICAL SUPPLY — 11 items
BAG SNAP BAND KOVER 36X36 (MISCELLANEOUS) ×2 IMPLANT
CATH BLAZERPRIME XP (ABLATOR) ×2 IMPLANT
CATH JOSEPHSON QUAD-ALLRED 6FR (CATHETERS) ×2 IMPLANT
CATH POLARIS X 2.5/5/2.5 DECAP (CATHETERS) ×2 IMPLANT
PACK EP LATEX FREE (CUSTOM PROCEDURE TRAY) ×1
PACK EP LF (CUSTOM PROCEDURE TRAY) ×1 IMPLANT
PAD DEFIB LIFELINK (PAD) ×2 IMPLANT
SHEATH PINNACLE 6F 10CM (SHEATH) ×2 IMPLANT
SHEATH PINNACLE 7F 10CM (SHEATH) ×2 IMPLANT
SHEATH PINNACLE 8F 10CM (SHEATH) ×2 IMPLANT
SHIELD RADPAD SCOOP 12X17 (MISCELLANEOUS) ×2 IMPLANT

## 2016-02-11 NOTE — Progress Notes (Signed)
PHARMACIST - PHYSICIAN COMMUNICATION  CONCERNING: P&T Medication Policy Regarding Oral Bisphosphonates  RECOMMENDATION: Your order for alendronate (Fosamax) has been discontinued at this time. Last dose 02/07/16. Next due 02/14/16.  If the patient's post-hospital medical condition warrants safe use of this class of drugs, please resume the pre-hospital regimen upon discharge.  DESCRIPTION:  Alendronate (Fosamax) can cause severe esophageal erosions in patients who are unable to remain upright at least 30 minutes after taking this medication.   Since brief interruptions in therapy are thought to have minimal impact on bone mineral density, the [place] has established that bisphosphonate orders should be routinely discontinued during hospitalization.   To override this safety policy and permit administration of Fosamax in the hospital, prescribers must write "DO NOT HOLD" in the comments section when placing the order for this class of medications.  Kelvin Cellar, [place] Pager: [phone] 02/11/2016 12:33 PM

## 2016-02-11 NOTE — Interval H&P Note (Signed)
History and Physical Interval Note:  02/11/2016 7:21 AM  Claudia Shelton  has presented today for surgery, with the diagnosis of flutter  The various methods of treatment have been discussed with the patient and family. After consideration of risks, benefits and other options for treatment, the patient has consented to  Procedure(s): A-Fluttter Ablation (N/A) as a surgical intervention .  The patient's history has been reviewed, patient examined, no change in status, stable for surgery.  I have reviewed the patient's chart and labs.  Questions were answered to the patient's satisfaction.     Cristopher Peru

## 2016-02-11 NOTE — Progress Notes (Addendum)
Site area: RFV x 3 Site Prior to Removal:  Level 0 Pressure Applied For:25 min Manual:   yes Patient Status During Pull:  stable Post Pull Site:  Level 0 Post Pull Instructions Given: yes  Post Pull Pulses Present:  Dressing Applied:  tegaderm Bedrest begins @ 1130 till 1730 Comments:

## 2016-02-11 NOTE — Progress Notes (Signed)
Pt completed bedrest at 5:30PM. Pt ambulated to bathroom. Pt ambulated 200 feet in hall. R femoral site level 0, no bruising, soft to touch. Pt and husband state they feel comfortable being discharged. Pt states she feels well.   Discussed with the patient and her husband and all questioned fully answered. She will call me if any problems arise. Educated patient on post-cath restrictions r/t driving, work, lifting. Pt verbalized understanding of all discharge instructions.  IV removed. Telemetry removed, CCMD notified.  Fritz Pickerel, RN

## 2016-02-11 NOTE — H&P (View-Only) (Signed)
HPI Mrs. Mcclymonds returns today for followup. I had seen her several months ago with palpitations and chest pressure. Subsequent evaluation with a 48 hour holter demonstrated rare PVC's and NS SVT (less than 5 seconds) and she had an abnormal stress test and underwent catheterization which demonstrated normal LV function and no CAD. In the interim she has developed atrial flutter with an RVR for which she sought medical attention. The episode lasted about 2hours and she was placed on anti-coagulation.   Allergies  Allergen Reactions  . Adhesive [Tape] Hives and Itching  . Latex Hives     Current Outpatient Prescriptions  Medication Sig Dispense Refill  . alendronate (FOSAMAX) 70 MG tablet Take 70 mg by mouth once a week. Take with a full glass of water on an empty stomach.    Marland Kitchen apixaban (ELIQUIS) 5 MG TABS tablet Take 1 tablet (5 mg total) by mouth 2 (two) times daily. 60 tablet 5  . ARMOUR THYROID 60 MG tablet Take 60-120 mg by mouth See admin instructions. Alternate taking 1 tablet one day then take 2 tablets the next day    . aspirin 81 MG chewable tablet Chew 81 mg by mouth daily.     . calcium carbonate (OS-CAL - DOSED IN MG OF ELEMENTAL CALCIUM) 1250 (500 Ca) MG tablet Take 2 tablets by mouth daily with breakfast.    . ezetimibe-simvastatin (VYTORIN) 10-20 MG per tablet Take 1 tablet by mouth daily.    . metFORMIN (GLUCOPHAGE-XR) 500 MG 24 hr tablet Take 500 mg by mouth daily.    . metoprolol succinate (TOPROL-XL) 50 MG 24 hr tablet Take 1 tablet (50 mg total) by mouth daily. 30 tablet 5  . Multiple Vitamin (MULTIVITAMIN WITH MINERALS) TABS tablet Take 1 tablet by mouth daily.    . Vitamin D, Ergocalciferol, (DRISDOL) 50000 units CAPS capsule Take 1 capsule by mouth once a week.  5   No current facility-administered medications for this visit.      Past Medical History:  Diagnosis Date  . BMI 28.0-28.9,adult   . Chest pain on exertion 2015  . Hypothyroidism   . Jaw disease    . Palpitations   . Pleuritic pain    right lung base  . Prediabetes    F/B Dr. [name]  . SOB (shortness of breath) on exertion 2015    ROS:   All systems reviewed and negative except as noted in the HPI.   Past Surgical History:  Procedure Laterality Date  . CERVICAL DISCECTOMY    . CESAREAN SECTION     x2  . COMPLETE HYSTERECTOMY     Not due to cancer. WITH OVARIES SPARED  . LEFT HEART CATHETERIZATION WITH CORONARY ANGIOGRAM N/A 06/12/2014   Procedure: LEFT HEART CATHETERIZATION WITH CORONARY ANGIOGRAM;  Surgeon: [name], MD;  Location: [facility] CATH LAB;  Service: Cardiovascular;  Laterality: N/A;  . THYROIDECTOMY Left    "pre-cancerous cells"     Family History  Problem Relation Age of Onset  . Diabetes Mellitus I Mother   . Hypertension Father   . Hyperlipidemia Father   . Heart attack Paternal Grandfather 43  . Stroke Paternal Aunt 58  . Heart attack Paternal Uncle   . Heart attack Cousin 33     Social History   Social History  . Marital status: Married    Spouse name: N/A  . Number of children: N/A  . Years of education: N/A   Occupational History  .  Not on file.   Social History Main Topics  . Smoking status: Never Smoker  . Smokeless tobacco: Never Used  . Alcohol use No  . Drug use: No  . Sexual activity: Yes    Birth control/ protection: None, Surgical     Comment: partial hysterctomy   Other Topics Concern  . Not on file   Social History Narrative  . No narrative on file     BP 138/68   Pulse 61   Ht 5\' 4"  (1.626 m)   Wt 168 lb 9.6 oz (76.5 kg)   BMI 28.94 kg/m   Physical Exam:  Well appearing middle aged woman, NAD HEENT: Unremarkable Neck:  No JVD, no thyromegally Lymphatics:  No adenopathy Back:  No CVA tenderness Lungs:  Clear with no wheezes HEART:  Regular rate rhythm, no murmurs, no rubs, no clicks Abd:  soft, positive bowel sounds, no organomegally, no rebound, no guarding Ext:  2 plus pulses, no edema, no  cyanosis, no clubbing Skin:  No rashes no nodules Neuro:  CN II through XII intact, motor grossly intact  EKG - NSR  Assess/Plan: 1. Atrial flutter - I have discussed the treatment options with the patient in detail. She would like to proceed with catheter ablation. The risks/benefits/goals/expectations of ablation were discussed and she wishes to proceed. 2. coags - her CHADSVASC score is 3. She will continue Eliquis, holding her dose on the morning of the procedure. 3. HTN - her blood pressure is reasonably well controlled. Will follow.  Mikle Bosworth.D.

## 2016-02-11 NOTE — Discharge Instructions (Signed)
No driving for 3 days. No lifting over 5 lbs for 1 week. No vigorous or sexual activity for 1 week. You may return to work on 02/19/16. Keep procedure site clean & dry. If you notice increased pain, swelling, bleeding or pus, call/return!  You may shower, but no soaking baths/hot tubs/pools for 1 week.    Continue your Eliquis without missing any doses until seen at your follow up appointment with Dr. Lovena Le.

## 2016-02-11 NOTE — Progress Notes (Signed)
Pt received from cath lab. R groin is level zero. Pt VSS, placed on frequent vitals. Pt denies pain. Pt oriented to room and equipment, call light within reach, willcontinue to monitor.  Fritz Pickerel, RN

## 2016-02-11 NOTE — Progress Notes (Signed)
The patient was seen by Dr.Taylor post procedure, site is stable and cleared to discharge once bedrest is completed and if site remains stable after ambulation VSS Telemetry is SR Activity restrictions and instructions were discussed with the patient/husband Follow up has been arranged  Tommye Standard, PA-C  EP Attending  Patient seen and examined. Agree with above.  Mikle Bosworth.D.

## 2016-03-04 ENCOUNTER — Encounter: Payer: Self-pay | Admitting: Gastroenterology

## 2016-03-04 ENCOUNTER — Encounter (INDEPENDENT_AMBULATORY_CARE_PROVIDER_SITE_OTHER): Payer: Self-pay

## 2016-03-04 ENCOUNTER — Telehealth: Payer: Self-pay | Admitting: *Deleted

## 2016-03-04 ENCOUNTER — Ambulatory Visit (INDEPENDENT_AMBULATORY_CARE_PROVIDER_SITE_OTHER): Payer: BC Managed Care – PPO | Admitting: Gastroenterology

## 2016-03-04 VITALS — BP 120/74 | HR 60 | Ht 63.0 in | Wt 164.8 lb

## 2016-03-04 DIAGNOSIS — Z1211 Encounter for screening for malignant neoplasm of colon: Secondary | ICD-10-CM

## 2016-03-04 MED ORDER — NA SULFATE-K SULFATE-MG SULF 17.5-3.13-1.6 GM/177ML PO SOLN
1.0000 | Freq: Once | ORAL | 0 refills | Status: AC
Start: 2016-03-04 — End: 2016-03-04

## 2016-03-04 NOTE — Patient Instructions (Signed)
You have been scheduled for a colonoscopy. Please follow written instructions given to you at your visit today.  Please pick up your prep supplies at the pharmacy within the next 1-3 days. If you use inhalers (even only as needed), please bring them with you on the day of your procedure. Your physician has requested that you go to www.startemmi.com and enter the access code given to you at your visit today. This web site gives a general overview about your procedure. However, you should still follow specific instructions given to you by our office regarding your preparation for the procedure.  We will contact you about holding your Eliquis

## 2016-03-04 NOTE — Telephone Encounter (Signed)
  03/04/2016   RE: Claudia Shelton DOB: 1961-04-08 MRN: [id number]   Dear  Dr Cristopher Peru    We have scheduled the above patient for an endoscopic procedure. Our records show that she is on anticoagulation therapy.   Please advise as to how long the patient may come off her therapy of Eliquis prior to the procedure, which is scheduled for 05/11/2016.  Please fax back/ or route the completed form to [place] at [phone].   Sincerely,    Genella Mech ,CMA AAMA

## 2016-03-04 NOTE — Progress Notes (Signed)
Claudia Shelton    [id number]    [birth year]  Primary Care Physician:HAMRICK,MAURA L, MD  Referring Physician: Leonides Sake, MD [address] [place], [place] [address]  Chief complaint:  Colorectal cancer screening  HPI: 55 year old female with history of atrial flutter status post radiofrequency ablation of a flutter here to discuss screening colonoscopy. He has no specific GI complaints, Denies any nausea, vomiting, abdominal pain, change in bowel habits, bloating, melena or bright red blood per rectum. No family history of colon cancer. Father had colon adenomatous polyps removed.   She had an abnormal stress test in 2015 and underwent catheterization which demonstrated normal LV function and no CAD. Last month she presented to the ER with sudden onset palpitations and chest pain was noted in  Aflutter with RVR, was started on Eliquis this and underwent ablation for Aflutter   Outpatient Encounter Prescriptions as of 03/04/2016  Medication Sig  . alendronate (FOSAMAX) 70 MG tablet Take 70 mg by mouth once a week. Take with a full glass of water on an empty stomach.  Marland Kitchen apixaban (ELIQUIS) 5 MG TABS tablet Take 1 tablet (5 mg total) by mouth 2 (two) times daily.  Francia Greaves THYROID 60 MG tablet Take 60-120 mg by mouth See admin instructions. Alternate taking 1 tablet one day then take 2 tablets the next day  . calcium carbonate (OS-CAL - DOSED IN MG OF ELEMENTAL CALCIUM) 1250 (500 Ca) MG tablet Take 2 tablets by mouth daily with breakfast.  . ezetimibe-simvastatin (VYTORIN) 10-20 MG per tablet Take 1 tablet by mouth daily.  . metFORMIN (GLUCOPHAGE-XR) 500 MG 24 hr tablet Take 500 mg by mouth daily.  . metoprolol succinate (TOPROL-XL) 50 MG 24 hr tablet Take 1 tablet (50 mg total) by mouth daily.  . Vitamin D, Ergocalciferol, (DRISDOL) 50000 units CAPS capsule Take 50,000 Units by mouth once a week. Thursday  . Na Sulfate-K Sulfate-Mg Sulf (SUPREP BOWEL PREP KIT)  17.5-3.13-1.6 GM/180ML SOLN Take 1 kit by mouth once.   No facility-administered encounter medications on file as of 03/04/2016.     Allergies as of 03/04/2016 - Review Complete 03/04/2016  Allergen Reaction Noted  . Adhesive [tape] Hives and Itching 06/10/2014  . Almond oil Itching and Swelling 02/11/2016  . Latex Hives 06/10/2014    Past Medical History:  Diagnosis Date  . BMI 28.0-28.9,adult   . Chest pain on exertion 2015  . Hypertension   . Hypothyroidism   . Palpitations   . Pleuritic pain    right lung base  . PONV (postoperative nausea and vomiting)   . SOB (shortness of breath) on exertion 2015  . TMJ (dislocation of temporomandibular joint)    "not as bad as it was; very minor now" (02/11/2016)  . Type II diabetes mellitus ([place])    "they say I'm prediabetic but I'm on RX" (02/11/2016)    Past Surgical History:  Procedure Laterality Date  . ABDOMINAL HYSTERECTOMY  2000s   not due to cancer; partial  . BACK SURGERY    . CERVICAL DISCECTOMY  2000s  . [place]; 1998  . ELECTROPHYSIOLOGIC STUDY N/A 02/11/2016   Procedure: A-Fluttter Ablation;  Surgeon: [name], MD;  Location: [facility] CV LAB;  Service: Cardiovascular;  Laterality: N/A;  . LEFT HEART CATHETERIZATION WITH CORONARY ANGIOGRAM N/A 06/12/2014   Procedure: LEFT HEART CATHETERIZATION WITH CORONARY ANGIOGRAM;  Surgeon: [name], MD;  Location: [facility]  CATH LAB;  Service: Cardiovascular;  Laterality: N/A;  . THYROIDECTOMY, PARTIAL Left    "pre-cancerous cells"    Family History  Problem Relation Age of Onset  . Diabetes Mellitus I Mother   . Hypertension Father   . Hyperlipidemia Father   . Colon polyps Father   . Heart attack Paternal Grandfather 35  . Stroke Paternal Aunt 71  . Heart attack Paternal Uncle   . Heart attack Cousin 57    Social History   Social History  . Marital status: Married    Spouse name: N/A  . Number of children: N/A  . Years of education: N/A    Occupational History  . Not on file.   Social History Main Topics  . Smoking status: Never Smoker  . Smokeless tobacco: Never Used  . Alcohol use No  . Drug use: No  . Sexual activity: Yes    Birth control/ protection: None, Surgical   Other Topics Concern  . Not on file   Social History Narrative  . No narrative on file      Review of systems: Review of Systems  Constitutional: Negative for fever and chills.  HENT: Negative.   Eyes: Negative for blurred vision.  Respiratory: Negative for cough, shortness of breath and wheezing.   Cardiovascular: Negative for chest pain and palpitations.  Gastrointestinal: as per HPI Genitourinary: Negative for dysuria, urgency, frequency and hematuria.  Musculoskeletal: Negative for myalgias, back pain and joint pain.  Skin: Negative for itching and rash.  Neurological: Negative for dizziness, tremors, focal weakness, seizures and loss of consciousness.  Endo/Heme/Allergies: Positive for seasonal allergies.  Psychiatric/Behavioral: Negative for depression, suicidal ideas and hallucinations.  All other systems reviewed and are negative.   Physical Exam: Vitals:   03/04/16 0909  BP: 120/74  Pulse: 60   Body mass index is 29.19 kg/m. Gen:      No acute distress HEENT:  EOMI, sclera anicteric Neck:     No masses; no thyromegaly Lungs:    Clear to auscultation bilaterally; normal respiratory effort CV:         Regular rate and rhythm; no murmurs Abd:      + bowel sounds; soft, non-tender; no palpable masses, no distension Ext:    No edema; adequate peripheral perfusion Skin:      Warm and dry; no rash Neuro: alert and oriented x 3 Psych: normal mood and affect  Data Reviewed:Reviewed chart in epic   Assessment and Plan/Recommendations:  55 year old female with history of A. Fib, a flutter status post radiofrequency ablation on Eliquis here to discuss screening colonoscopy She has no specific GI complaints or symptoms The  risks and benefits as well as alternatives of endoscopic procedure(s) have been discussed and reviewed. All questions answered. The patient agrees to proceed. We'll discuss with cardiology if it's okay to hold Eliquis 2 days prior to the procedure Return as needed  K. Denzil Magnuson , MD [phone] Mon-Fri 8a-5p [phone] after 5p, weekends, holidays  CC: Hamrick, Lorin Mercy, MD

## 2016-03-10 ENCOUNTER — Encounter: Payer: Self-pay | Admitting: Internal Medicine

## 2016-03-10 ENCOUNTER — Ambulatory Visit (INDEPENDENT_AMBULATORY_CARE_PROVIDER_SITE_OTHER): Payer: BC Managed Care – PPO | Admitting: Internal Medicine

## 2016-03-10 VITALS — BP 140/86 | HR 60 | Ht 64.0 in | Wt 164.2 lb

## 2016-03-10 DIAGNOSIS — R002 Palpitations: Secondary | ICD-10-CM

## 2016-03-10 NOTE — Patient Instructions (Signed)
Medication Instructions:  Your physician recommends that you continue on your current medications as directed. Please refer to the Current Medication list given to you today.   Labwork: None Ordered   Testing/Procedures: None Ordered   Follow-Up: Your physician recommends that you schedule a follow-up appointment in: as needed with Dr. Lovena Le.    If you need a refill on your cardiac medications before your next appointment, please call your pharmacy.   Thank you for choosing [facility]! Christen Bame, RN [phone]

## 2016-03-10 NOTE — Progress Notes (Signed)
HPI Claudia Shelton returns today for followup.She is a pleasant 55 yo woman with a h/o atrial flutter who underwent catheter ablation several weeks ago. In the interim, she has been stable. She has had rare palpitations. No chest pain or edema.  Allergies  Allergen Reactions  . Adhesive [Tape] Hives and Itching  . Almond Oil Itching and Swelling  . Latex Hives     Current Outpatient Prescriptions  Medication Sig Dispense Refill  . alendronate (FOSAMAX) 70 MG tablet Take 70 mg by mouth once a week. Take with a full glass of water on an empty stomach.    Marland Kitchen apixaban (ELIQUIS) 5 MG TABS tablet Take 1 tablet (5 mg total) by mouth 2 (two) times daily. 60 tablet 5  . ARMOUR THYROID 60 MG tablet Take 60-120 mg by mouth See admin instructions. Alternate taking 1 tablet one day then take 2 tablets the next day    . calcium carbonate (OS-CAL - DOSED IN MG OF ELEMENTAL CALCIUM) 1250 (500 Ca) MG tablet Take 2 tablets by mouth daily with breakfast.    . ezetimibe-simvastatin (VYTORIN) 10-20 MG per tablet Take 1 tablet by mouth daily.    . metFORMIN (GLUCOPHAGE-XR) 500 MG 24 hr tablet Take 500 mg by mouth daily.    . metoprolol succinate (TOPROL-XL) 50 MG 24 hr tablet Take 1 tablet (50 mg total) by mouth daily. 30 tablet 5  . Vitamin D, Ergocalciferol, (DRISDOL) 50000 units CAPS capsule Take 50,000 Units by mouth once a week. Thursday  5   No current facility-administered medications for this visit.      Past Medical History:  Diagnosis Date  . BMI 28.0-28.9,adult   . Chest pain on exertion 2015  . Hypertension   . Hypothyroidism   . Palpitations   . Pleuritic pain    right lung base  . PONV (postoperative nausea and vomiting)   . SOB (shortness of breath) on exertion 2015  . TMJ (dislocation of temporomandibular joint)    "not as bad as it was; very minor now" (02/11/2016)  . Type II diabetes mellitus ([place])    "they say I'm prediabetic but I'm on RX" (02/11/2016)    ROS:   All  systems reviewed and negative except as noted in the HPI.   Past Surgical History:  Procedure Laterality Date  . ABDOMINAL HYSTERECTOMY  2000s   not due to cancer; partial  . BACK SURGERY    . CERVICAL DISCECTOMY  2000s  . [place]; 1998  . ELECTROPHYSIOLOGIC STUDY N/A 02/11/2016   Procedure: A-Fluttter Ablation;  Surgeon: [name], MD;  Location: [facility] CV LAB;  Service: Cardiovascular;  Laterality: N/A;  . LEFT HEART CATHETERIZATION WITH CORONARY ANGIOGRAM N/A 06/12/2014   Procedure: LEFT HEART CATHETERIZATION WITH CORONARY ANGIOGRAM;  Surgeon: [name], MD;  Location: [facility] CATH LAB;  Service: Cardiovascular;  Laterality: N/A;  . THYROIDECTOMY, PARTIAL Left    "pre-cancerous cells"     Family History  Problem Relation Age of Onset  . Diabetes Mellitus I Mother   . Hypertension Father   . Hyperlipidemia Father   . Colon polyps Father   . Heart attack Paternal Grandfather 41  . Stroke Paternal Aunt 71  . Heart attack Paternal Uncle   . Heart attack Cousin 4     Social History   Social History  . Marital status: Married    Spouse name: N/A  . Number of children: N/A  .  Years of education: N/A   Occupational History  . Not on file.   Social History Main Topics  . Smoking status: Never Smoker  . Smokeless tobacco: Never Used  . Alcohol use No  . Drug use: No  . Sexual activity: Yes    Birth control/ protection: None, Surgical   Other Topics Concern  . Not on file   Social History Narrative  . No narrative on file     BP 140/86   Pulse 60   Ht 5\' 4"  (1.626 m)   Wt 164 lb 3.2 oz (74.5 kg)   SpO2 96%   BMI 28.18 kg/m   Physical Exam:  Well appearing middle aged woman, NAD HEENT: Unremarkable Neck:  No JVD, no thyromegally Lymphatics:  No adenopathy Back:  No CVA tenderness Lungs:  Clear with no wheezes HEART:  Regular rate rhythm, no murmurs, no rubs, no clicks Abd:  soft, positive bowel sounds, no organomegally, no  rebound, no guarding Ext:  2 plus pulses, no edema, no cyanosis, no clubbing Skin:  No rashes no nodules Neuro:  CN II through XII intact, motor grossly intact  EKG - NSR  Assess/Plan: 1. Atrial flutter - she is doing well, s/p ablation. No evidence of recurrence 2. coags - her CHADSVASC score is 3. She has had no evidence of recurrence of atrial arrhythmias. I have recommended she stop her Eliquis and take and 81 mg ASA a day. 3. HTN - her blood pressure is reasonably well controlled.   Mikle Bosworth.D.

## 2016-03-18 NOTE — Telephone Encounter (Signed)
Patient takes Eliquis for atrial flutter, CHADS2 score is 2 (HTN and DM). [place] for pt to hold Eliquis for 24 hours prior to endoscopy per protocol. Clearance routed to Genella Mech, CMA.

## 2016-03-18 NOTE — Telephone Encounter (Signed)
Left message for patient to return call about holding blood thinner

## 2016-03-29 NOTE — Telephone Encounter (Signed)
Left two messages for patient to return call about blood thinner

## 2016-05-11 ENCOUNTER — Ambulatory Visit (AMBULATORY_SURGERY_CENTER): Payer: BC Managed Care – PPO | Admitting: Gastroenterology

## 2016-05-11 ENCOUNTER — Encounter: Payer: Self-pay | Admitting: Gastroenterology

## 2016-05-11 VITALS — BP 155/78 | HR 56 | Temp 96.6°F | Resp 10 | Ht 63.0 in | Wt 164.0 lb

## 2016-05-11 DIAGNOSIS — D123 Benign neoplasm of transverse colon: Secondary | ICD-10-CM | POA: Diagnosis not present

## 2016-05-11 DIAGNOSIS — Z1211 Encounter for screening for malignant neoplasm of colon: Secondary | ICD-10-CM

## 2016-05-11 DIAGNOSIS — Z1212 Encounter for screening for malignant neoplasm of rectum: Secondary | ICD-10-CM

## 2016-05-11 DIAGNOSIS — D124 Benign neoplasm of descending colon: Secondary | ICD-10-CM | POA: Diagnosis not present

## 2016-05-11 LAB — GLUCOSE, CAPILLARY
Glucose-Capillary: 108 mg/dL — ABNORMAL HIGH (ref 65–99)
Glucose-Capillary: 113 mg/dL — ABNORMAL HIGH (ref 65–99)

## 2016-05-11 MED ORDER — SODIUM CHLORIDE 0.9 % IV SOLN: 500.0000 mL | INTRAVENOUS | Status: DC

## 2016-05-11 NOTE — Op Note (Addendum)
Jud Patient Name: Claudia Shelton Procedure Date: 05/11/2016 7:42 AM MRN: [id number] Endoscopist: Mauri Pole , MD Age: 55 Referring MD:  Date of Birth: [birth year] Gender: Female Account #: [id number] Procedure:                Colonoscopy Indications:              Screening for colorectal malignant neoplasm, This                            is the patient's first colonoscopy Medicines:                Monitored Anesthesia Care Procedure:                Pre-Anesthesia Assessment:                           - Prior to the procedure, a History and Physical                            was performed, and patient medications and                            allergies were reviewed. The patient's tolerance of                            previous anesthesia was also reviewed. The risks                            and benefits of the procedure and the sedation                            options and risks were discussed with the patient.                            All questions were answered, and informed consent                            was obtained. Prior Anticoagulants: The patient has                            taken no previous anticoagulant or antiplatelet                            agents. ASA Grade Assessment: II - A patient with                            mild systemic disease. After reviewing the risks                            and benefits, the patient was deemed in                            satisfactory condition to undergo the procedure.  After obtaining informed consent, the colonoscope                            was passed under direct vision. Throughout the                            procedure, the patient's blood pressure, pulse, and                            oxygen saturations were monitored continuously. The                            Model CF-HQ190L [id number] scope was introduced                            through the anus and  advanced to the the terminal                            ileum, with identification of the appendiceal                            orifice and IC valve. The colonoscopy was performed                            without difficulty. The patient tolerated the                            procedure well. The quality of the bowel                            preparation was excellent. The terminal ileum,                            ileocecal valve, appendiceal orifice, and rectum                            were photographed. Scope In: 8:13:44 AM Scope Out: 8:34:47 AM Scope Withdrawal Time: 0 hours 17 minutes 24 seconds  Total Procedure Duration: 0 hours 21 minutes 3 seconds  Findings:                 The perianal and digital rectal examinations were                            normal.                           A 20 mm polyp was found in the hepatic flexure. The                            polyp was flat. Area was successfully injected with                            4 mL saline for a lift polypectomy. Preparations  were made for mucosal resection. Saline was                            injected to raise the lesion. Snare mucosal                            resection was performed. Resection and retrieval                            were complete.                           A 5 mm polyp was found in the transverse colon. The                            polyp was sessile. The polyp was removed with a                            cold snare. Resection and retrieval were complete.                           A 7 mm polyp was found in the descending colon. The                            polyp was sessile. The polyp was removed with a hot                            snare. Resection and retrieval were complete.                           Non-bleeding internal hemorrhoids were found during                            retroflexion. The hemorrhoids were medium-sized.                           The  terminal ileum contained a few two mm ulcers.                            No bleeding was present. Complications:            No immediate complications. Estimated Blood Loss:     Estimated blood loss was minimal. Impression:               - One 20 mm polyp at the hepatic flexure, removed                            with mucosal resection. Resected and retrieved.                            Injected.                           - One 5 mm polyp in the transverse colon, removed  with a cold snare. Resected and retrieved.                           - One 7 mm polyp in the descending colon, removed                            with a hot snare. Resected and retrieved.                           - Non-bleeding internal hemorrhoids.                           - A few ulcers in the terminal ileum possibl;y                            secondary to NSAID's.                           - Mucosal resection was performed. Resection and                            retrieval were complete. Recommendation:           - Patient has a contact number available for                            emergencies. The signs and symptoms of potential                            delayed complications were discussed with the                            patient. Return to normal activities tomorrow.                            Written discharge instructions were provided to the                            patient.                           - Resume previous diet.                           - Continue present medications.                           - Await pathology results.                           - Repeat colonoscopy in 3 years for surveillance                            based on pathology results.                           - Patient has  a contact number available for                            emergencies. The signs and symptoms of potential                            delayed complications were discussed with  the                            patient. Return to normal activities tomorrow.                            Written discharge instructions were provided to the                            patient.                           - No aspirin, ibuprofen, naproxen, or other                            non-steroidal anti-inflammatory drugs. Mauri Pole, MD 05/11/2016 8:40:33 AM This report has been signed electronically.

## 2016-05-11 NOTE — Progress Notes (Signed)
Report to PACU, RN, vss, BBS= Clear.  

## 2016-05-11 NOTE — Progress Notes (Signed)
Called to room to assist during endoscopic procedure.  Patient ID and intended procedure confirmed with present staff. Received instructions for my participation in the procedure from the performing physician.  

## 2016-05-11 NOTE — Patient Instructions (Signed)
YOU HAD AN ENDOSCOPIC PROCEDURE TODAY AT [facility]:   Refer to the procedure report that was given to you for any specific questions about what was found during the examination.  If the procedure report does not answer your questions, please call your gastroenterologist to clarify.  If you requested that your care partner not be given the details of your procedure findings, then the procedure report has been included in a sealed envelope for you to review at your convenience later.  YOU SHOULD EXPECT: Some feelings of bloating in the abdomen. Passage of more gas than usual.  Walking can help get rid of the air that was put into your GI tract during the procedure and reduce the bloating. If you had a lower endoscopy (such as a colonoscopy or flexible sigmoidoscopy) you may notice spotting of blood in your stool or on the toilet paper. If you underwent a bowel prep for your procedure, you may not have a normal bowel movement for a few days.  Please Note:  You might notice some irritation and congestion in your nose or some drainage.  This is from the oxygen used during your procedure.  There is no need for concern and it should clear up in a day or so.  SYMPTOMS TO REPORT IMMEDIATELY:   Following lower endoscopy (colonoscopy or flexible sigmoidoscopy):  Excessive amounts of blood in the stool  Significant tenderness or worsening of abdominal pains  Swelling of the abdomen that is new, acute  Fever of 100F or higher    For urgent or emergent issues, a gastroenterologist can be reached at any hour by calling [phone].   DIET:  We do recommend a small meal at first, but then you may proceed to your regular diet.  Drink plenty of fluids but you should avoid alcoholic beverages for 24 hours.  ACTIVITY:  You should plan to take it easy for the rest of today and you should NOT DRIVE or use heavy machinery until tomorrow (because of the sedation medicines used during the test).     FOLLOW UP: Our staff will call the number listed on your records the next business day following your procedure to check on you and address any questions or concerns that you may have regarding the information given to you following your procedure. If we do not reach you, we will leave a message.  However, if you are feeling well and you are not experiencing any problems, there is no need to return our call.  We will assume that you have returned to your regular daily activities without incident.  If any biopsies were taken you will be contacted by phone or by letter within the next 1-3 weeks.  Please call us at [phone] if you have not heard about the biopsies in 3 weeks.    SIGNATURES/CONFIDENTIALITY: You and/or your care partner have signed paperwork which will be entered into your electronic medical record.  These signatures attest to the fact that that the information above on your After Visit Summary has been reviewed and is understood.  Full responsibility of the confidentiality of this discharge information lies with you and/or your care-partner.  Polyp and hemorrhoid information given.  No aspirin, ibuprfen, or naproxen .

## 2016-05-12 ENCOUNTER — Telehealth: Payer: Self-pay | Admitting: *Deleted

## 2016-05-12 NOTE — Telephone Encounter (Signed)
  Follow up Call-  Call back number 05/11/2016  Post procedure Call Back phone  # [phone]  Permission to leave phone message Yes  Some recent data might be hidden     Patient questions:  Do you have a fever, pain , or abdominal swelling? No. Pain Score  0 *  Have you tolerated food without any problems? Yes.    Have you been able to return to your normal activities? Yes.    Do you have any questions about your discharge instructions: Diet   No. Medications  No. Follow up visit  No.  Do you have questions or concerns about your Care? No.  Actions: * If pain score is 4 or above: No action needed, pain <4.

## 2016-05-19 ENCOUNTER — Encounter: Payer: Self-pay | Admitting: Gastroenterology

## 2016-07-23 ENCOUNTER — Other Ambulatory Visit: Payer: Self-pay | Admitting: Physician Assistant

## 2017-02-25 ENCOUNTER — Other Ambulatory Visit: Payer: Self-pay | Admitting: Internal Medicine

## 2017-03-22 ENCOUNTER — Other Ambulatory Visit: Payer: Self-pay

## 2017-03-23 ENCOUNTER — Other Ambulatory Visit: Payer: Self-pay

## 2017-03-23 MED ORDER — METOPROLOL SUCCINATE ER 50 MG PO TB24: 50.0000 mg | ORAL_TABLET | Freq: Every day | ORAL | 0 refills | Status: DC

## 2017-04-21 ENCOUNTER — Other Ambulatory Visit: Payer: Self-pay | Admitting: Internal Medicine

## 2017-04-25 IMAGING — CR DG CHEST 1V PORT
1 series · 1 of 1 positions shown · non-contrast
Comparison: 11/04/2009

CLINICAL DATA: Pt states that she began feeling like her heart was
beating fast this evening. Pt sates that she has a hx of HTN and
diabetes.

EXAM:
PORTABLE CHEST 1 VIEW

[AP]
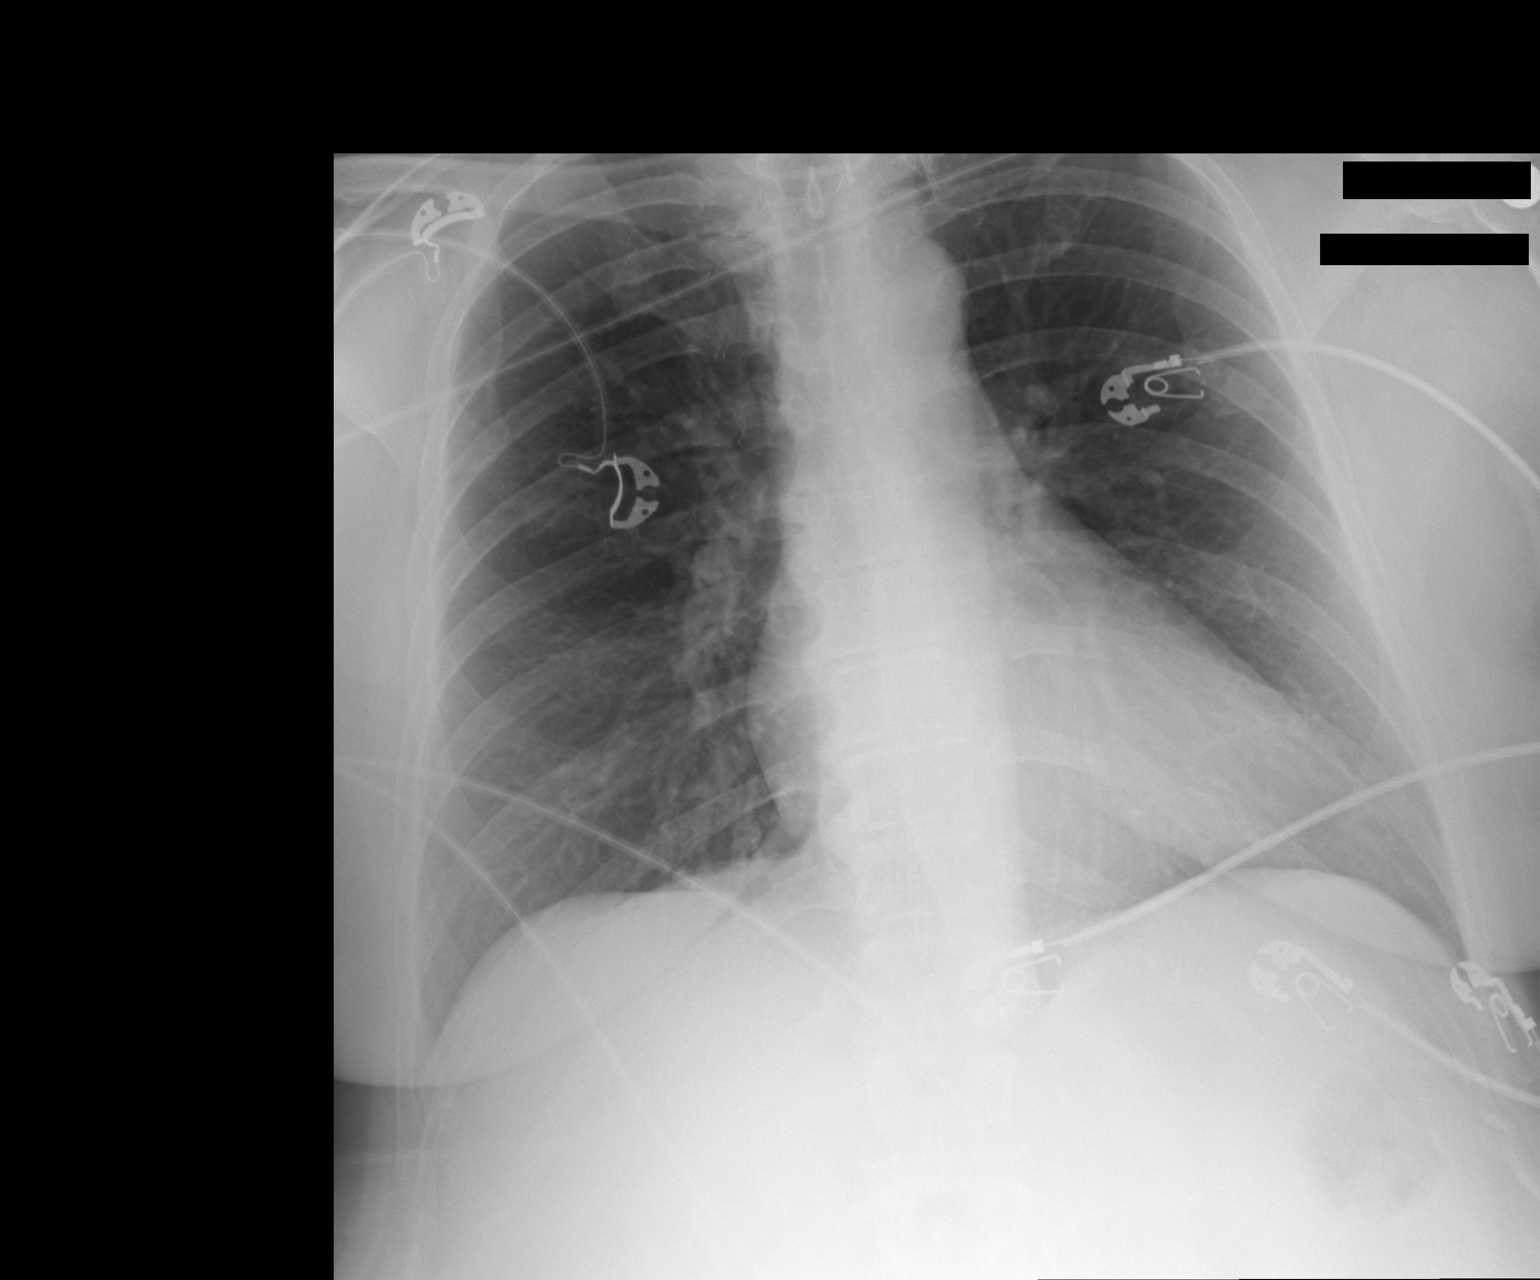

[1 of 1 positions shown; findings below may reference images not displayed]

FINDINGS: The heart size and mediastinal contours are within normal limits.
Both lungs are clear. No pleural effusion or pneumothorax. The
visualized skeletal structures are unremarkable.
IMPRESSION: No active disease.

## 2017-04-29 ENCOUNTER — Encounter: Payer: Self-pay | Admitting: Gastroenterology

## 2017-05-13 ENCOUNTER — Other Ambulatory Visit: Payer: Self-pay | Admitting: Internal Medicine

## 2017-05-16 ENCOUNTER — Telehealth: Payer: Self-pay | Admitting: Internal Medicine

## 2017-05-16 NOTE — Telephone Encounter (Signed)
New Message   *STAT* If patient is at the pharmacy, call can be transferred to refill team.   1. Which medications need to be refilled? (please list name of each medication and dose if known) Metoprolol 50mg   2. Which pharmacy/location (including street and city if local pharmacy) is medication to be sent to? [facility], [place]  3. Do they need a 30 day or 90 day supply? [place]

## 2017-05-16 NOTE — Telephone Encounter (Signed)
Patient is PRN follow up with Dr Lovena Le. She called and scheduled an appointment as a note to do so was put on her last several refills. Okay to refill or should the patient cancel appointment and obtain from pcp? Please advise. Thanks, MI

## 2017-05-16 NOTE — Telephone Encounter (Signed)
Call placed to Pt.  Asked Pt if she would like to continue to be seen by EP team for metoprolol refills, or she could have PCP take over as script signer.  Per Pt, she is unsure if her PCP would be willing to fill her metoprolol.  Pt states she will call PCP tomorrow and find out.  If her PCP will not assume signing for metoprolol she will follow up with Renee and possibly see Dr. Lovena Le q 2 years for metoprolol script.  Pt will return this nurse call tomorrow with further information.

## 2017-05-17 MED ORDER — METOPROLOL SUCCINATE ER 50 MG PO TB24: ORAL_TABLET | ORAL | 0 refills | Status: DC

## 2017-05-17 NOTE — Telephone Encounter (Signed)
Per patient, her PCP will not sign for her metoprolol prescription.  Patient with f/u appt in January 2019.  Authorized 90 days of metoprolol no refills.  Will discuss with Dr. Lovena Le if Pt may be seen q 2 years and we will fill metoprolol.  Will follow.

## 2017-05-18 ENCOUNTER — Telehealth: Payer: Self-pay

## 2017-05-18 NOTE — Telephone Encounter (Signed)
error 

## 2017-05-18 NOTE — Telephone Encounter (Signed)
Per discussion with Dr. Lovena Le, [place] for Pt to be seen every 2 years and ok to fill metoprolol for 2 years at a time.

## 2017-06-13 NOTE — Progress Notes (Signed)
Cardiology Office Note Date:  06/15/2017  Patient ID:  Claudia Shelton 08-16-1960, MRN [id number] PCP:  Leonides Sake, MD  Cardiologist:  Dr. Lovena Le    Chief Complaint: annual visit  History of Present Illness: Claudia Shelton is a 56 y.o. female with history of AFlutter ablated, HTN, Hypothyroidism, DM.  She comes today to be seen for dr. Lovena Le, last seen by him in Sept 2017.  At that time she was post ablation and doing well, her Eliquis stopped and low dose ASA started.  Chart notes report planned going forward for every other follow ups if doing well.   Generally feeling well, no CP, no dizziness, near syncope or syncope.  She does have palpitations, generally at least 2-3 x day, most days, a brief feeling of a quiver feeling, this is somewhat reminiscent of her AFlutter though shorter, often lasting only a few seconds.  Yesterday she forgot to take her metoprolol as usual in the morning, and last night felt like her heart beat was unusual, not irregular, but somehow different and made her feel a little SOB.  No SOB otherwise.  She is active daily though no dedicated exercise, she denies any change to her exertional capacity, no difficulty with her ADLs.   Past Medical History:  Diagnosis Date  . BMI 28.0-28.9,adult   . Chest pain on exertion 2015  . Hypertension   . Hypothyroidism   . Palpitations   . Pleuritic pain    right lung base  . PONV (postoperative nausea and vomiting)   . SOB (shortness of breath) on exertion 2015  . TMJ (dislocation of temporomandibular joint)    "not as bad as it was; very minor now" (02/11/2016)  . Type II diabetes mellitus ([place])    "they say I'm prediabetic but I'm on RX" (02/11/2016)    Past Surgical History:  Procedure Laterality Date  . ABDOMINAL HYSTERECTOMY  2000s   not due to cancer; partial  . BACK SURGERY    . CERVICAL DISCECTOMY  2000s  . [facility]; 1998  . ELECTROPHYSIOLOGIC STUDY N/A 02/11/2016   Procedure:  A-Fluttter Ablation;  Surgeon: [name], MD;  Location: [facility];  Service: Cardiovascular;  Laterality: N/A;  . LEFT HEART CATHETERIZATION WITH CORONARY ANGIOGRAM N/A 06/12/2014   Procedure: LEFT HEART CATHETERIZATION WITH CORONARY ANGIOGRAM;  Surgeon: [name], MD;  Location: [facility];  Service: Cardiovascular;  Laterality: N/A;  . THYROIDECTOMY, PARTIAL Left    "pre-cancerous cells"    Current Outpatient Medications  Medication Sig Dispense Refill  . alendronate (FOSAMAX) 70 MG tablet Take 70 mg by mouth once a week. Take with a full glass of water on an empty stomach.    Marland Kitchen apixaban (ELIQUIS) 5 MG TABS tablet Take 1 tablet (5 mg total) by mouth 2 (two) times daily. 60 tablet 5  . ARMOUR THYROID 60 MG tablet Take 60-120 mg by mouth See admin instructions. Alternate taking 1 tablet one day then take 2 tablets the next day    . calcium carbonate (OS-CAL - DOSED IN MG OF ELEMENTAL CALCIUM) 1250 (500 Ca) MG tablet Take 2 tablets by mouth daily with breakfast.    . denosumab (PROLIA) 60 MG/ML SOLN injection Inject 60 mg into the skin every 6 (six) months. Administer in upper arm, thigh, or abdomen    . diclofenac sodium (VOLTAREN) 1 % GEL Apply 1 g topically 3 (three) times daily. X 30 days  1  . ezetimibe (ZETIA) 10 MG tablet Take 10 mg by mouth daily.    . metFORMIN (GLUCOPHAGE-XR) 500 MG 24 hr tablet Take 500 mg by mouth daily.    . metoprolol succinate (TOPROL-XL) 50 MG 24 hr tablet Take 1 tablet (50 mg total) daily by mouth. Take with or immediately following a meal.  Follow up scheduled for January. 90 tablet 0  . simvastatin (ZOCOR) 20 MG tablet Take 20 mg by mouth daily.    . Vitamin D, Ergocalciferol, (DRISDOL) 50000 units CAPS capsule Take 50,000 Units by mouth once a week. Thursday  5   Current Facility-Administered Medications  Medication Dose Route Frequency Provider Last Rate Last Dose  . 0.9 %  sodium chloride infusion  500 mL Intravenous Continuous  Nandigam, Kavitha V, MD      . 0.9 %  sodium chloride infusion  500 mL Intravenous Continuous Nandigam, Venia Minks, MD        Allergies:   Adhesive [tape]; Almond oil; and Latex   Social History:  The patient  reports that  has never smoked. she has never used smokeless tobacco. She reports that she does not drink alcohol or use drugs.   Family History:  The patient's family history includes Colon polyps in her father; Diabetes Mellitus I in her mother; Heart attack in her paternal uncle; Heart attack (age of onset: 3) in her cousin; Heart attack (age of onset: 32) in her paternal grandfather; Hyperlipidemia in her father; Hypertension in her father; Stroke (age of onset: 56) in her paternal aunt.  ROS:  Please see the history of present illness.   All other systems are reviewed and otherwise negative.   PHYSICAL EXAM:  VS:  BP 140/86   Pulse 79   Ht 5\' 3"  (1.6 m)   Wt 170 lb 9.6 oz (77.4 kg)   SpO2 97%   BMI 30.22 kg/m  BMI: Body mass index is 30.22 kg/m. Well nourished, well developed, in no acute distress  HEENT: normocephalic, atraumatic  Neck: no JVD, carotid bruits or masses Cardiac:  RRR; no murmurs, no rubs, or gallops Lungs:  CTA b/l, no wheezing, rhonchi or rales  Abd: soft, nontender MS: no deformity or atrophy Ext: no edema  Skin: warm and dry, no rash Neuro:  No gross deficits appreciated Psych: euthymic mood, full affect    EKG:  Done today and reviewed by myself is SR, 71bpm, PR 184ms, QRS 49ms, QTc 42ms  02/10/17: EPS/Ablation CONCLUSIONS:  1. Inducible, typical, isthmus dependent atrial flutter.  2. Spontaneous atrial fibrillation  3. Successful radiofrequency ablation of atrial flutter along the cavotricuspid isthmus with complete bidirectional isthmus block achieved.  4. No inducible arrhythmias following ablation.  5. No early apparent complications.   01/22/16: TTE Study Conclusions - Left ventricle: The cavity size was normal. Wall thickness was    normal. Systolic function was normal. The estimated ejection   fraction was in the range of 55% to 60%. Wall motion was normal;   there were no regional wall motion abnormalities. Left   ventricular diastolic function parameters were normal. - Aortic valve: There was trivial regurgitation.  Recent Labs: No results found for requested labs within last 8760 hours.  No results found for requested labs within last 8760 hours.   CrCl cannot be calculated (Patient's most recent lab result is older than the maximum 21 days allowed.).   Wt Readings from Last 3 Encounters:  06/15/17 170 lb 9.6 oz (77.4 kg)  05/11/16 164  lb (74.4 kg)  03/10/16 164 lb 3.2 oz (74.5 kg)     Other studies reviewed: Additional studies/records reviewed today include: summarized above  ASSESSMENT AND PLAN:  1. AFlutter ablated Aug 2017     CHA2DS2Vasc is 3, off a/c post ablation     Daily palpitations, generally very fleeting  2. HTN     No medication changes     Discussed exercise, weight loss and minimizing sodium.   Disposition: given her palpitations, 48hour monitor to evaluate for possible AFib, less likely recurrent flutter post ablation, suspect perhaps PACs given brief nature of her palpitations. BP/HR would allow for more BB if needed, pending findings.  I have recommended a 3 month f/u given palpitations.  If her holter is negative we can move this out  Current medicines are reviewed at length with the patient today.  The patient did not have any concerns regarding medicines.  Venetia Night, PA-C 06/15/2017 2:17 PM     [facility] [address] [place] [address] [phone] (office)  [phone] (fax)

## 2017-06-15 ENCOUNTER — Encounter: Payer: Self-pay | Admitting: Physician Assistant

## 2017-06-15 ENCOUNTER — Ambulatory Visit: Payer: BC Managed Care – PPO | Admitting: Physician Assistant

## 2017-06-15 VITALS — BP 140/86 | HR 79 | Ht 63.0 in | Wt 170.6 lb

## 2017-06-15 DIAGNOSIS — R002 Palpitations: Secondary | ICD-10-CM

## 2017-06-15 DIAGNOSIS — I10 Essential (primary) hypertension: Secondary | ICD-10-CM

## 2017-06-15 DIAGNOSIS — I483 Typical atrial flutter: Secondary | ICD-10-CM | POA: Diagnosis not present

## 2017-06-15 MED ORDER — METOPROLOL SUCCINATE ER 50 MG PO TB24: ORAL_TABLET | ORAL | 1 refills | Status: DC

## 2017-06-15 NOTE — Patient Instructions (Addendum)
Medication Instructions:    Your physician recommends that you continue on your current medications as directed. Please refer to the Current Medication list given to you today.     If you need a refill on your cardiac medications before your next appointment, please call your pharmacy.  Labwork: NONE ORDERED  TODAY   Testing/Procedures: Your physician has recommended that you wear a holter monitor. Holter monitors are medical devices that record the heart's electrical activity. Doctors most often use these monitors to diagnose arrhythmias. Arrhythmias are problems with the speed or rhythm of the heartbeat. The monitor is a small, portable device. You can wear one while you do your normal daily activities. This is usually used to diagnose what is causing palpitations/syncope (passing out). ;    Follow-Up:  3 MONTHS WITH URSUY OR  TAYLOR    Any Other Special Instructions Will Be Listed Below (If Applicable).

## 2017-07-25 ENCOUNTER — Encounter (INDEPENDENT_AMBULATORY_CARE_PROVIDER_SITE_OTHER): Payer: Self-pay

## 2017-07-25 ENCOUNTER — Ambulatory Visit (INDEPENDENT_AMBULATORY_CARE_PROVIDER_SITE_OTHER): Payer: BC Managed Care – PPO

## 2017-07-25 DIAGNOSIS — I483 Typical atrial flutter: Secondary | ICD-10-CM | POA: Diagnosis not present

## 2017-08-15 ENCOUNTER — Encounter: Payer: Self-pay | Admitting: Gastroenterology

## 2017-08-17 ENCOUNTER — Encounter: Payer: Self-pay | Admitting: Physician Assistant

## 2017-08-25 ENCOUNTER — Encounter: Payer: Self-pay | Admitting: Physician Assistant

## 2017-08-25 ENCOUNTER — Ambulatory Visit: Payer: BC Managed Care – PPO | Admitting: Physician Assistant

## 2017-08-25 ENCOUNTER — Encounter (INDEPENDENT_AMBULATORY_CARE_PROVIDER_SITE_OTHER): Payer: Self-pay

## 2017-08-25 VITALS — BP 144/72 | HR 76 | Ht 63.0 in | Wt 169.2 lb

## 2017-08-25 DIAGNOSIS — R002 Palpitations: Secondary | ICD-10-CM | POA: Diagnosis not present

## 2017-08-25 DIAGNOSIS — I483 Typical atrial flutter: Secondary | ICD-10-CM

## 2017-08-25 DIAGNOSIS — I10 Essential (primary) hypertension: Secondary | ICD-10-CM

## 2017-08-25 MED ORDER — METOPROLOL SUCCINATE ER 50 MG PO TB24: ORAL_TABLET | ORAL | 1 refills | Status: DC

## 2017-08-25 NOTE — Progress Notes (Signed)
Cardiology Office Note Date:  08/25/2017  Patient ID:  Claudia Shelton, Claudia Shelton 01-24-1961, MRN [id number] PCP:  Leonides Sake, MD  Cardiologist:  Dr. Lovena Le    Chief Complaint: annual visit  History of Present Illness: Claudia Shelton is a 57 y.o. female with history of AFlutter ablated, HTN, Hypothyroidism, DM.  She comes today to be seen for Dr. Lovena Le, last seen by him in Sept 2017.  At that time she was post ablation and doing well, her Eliquis stopped and low dose ASA started.  Chart notes report planned going forward for every other year/PRN follow ups if doing well.   I saw her last in Jan 2019, generally reported she was feeling well, no CP, no dizziness, near syncope or syncope.  She mentioned palpitations, generally at least 2-3 x day, most days, a brief feeling of a quiver feeling, this was somewhat reminiscent of her AFlutter though shorter, often lasting only a few seconds.  The day prior to her appt she forgot to take her metoprolol as usual in the morning, and that night felt like her heart beat was unusual, not irregular, but somehow different and made her feel a little SOB.  No SOB otherwise.  She reported being active daily though no dedicated exercise, she denied any change to her exertional capacity, no difficulty with her ADLs. She was ordered for a 48 monitor given her daily symptoms, this noted SR with rare PVCs only.  She is doing well, palpitations are less and not bothersome.  Since her last visit with [place] her thyroid replacement has been increased, she has f/u on this next month.  No CP or SOB, no dizziness, near syncope or syncope.  Not exercising but reports always on the move and active, no exertional intolerances.   Past Medical History:  Diagnosis Date  . BMI 28.0-28.9,adult   . Chest pain on exertion 2015  . Hypertension   . Hypothyroidism   . Palpitations   . Pleuritic pain    right lung base  . PONV (postoperative nausea and vomiting)   . SOB (shortness of  breath) on exertion 2015  . TMJ (dislocation of temporomandibular joint)    "not as bad as it was; very minor now" (02/11/2016)  . Type II diabetes mellitus ([place])    "they say I'm prediabetic but I'm on RX" (02/11/2016)    Past Surgical History:  Procedure Laterality Date  . ABDOMINAL HYSTERECTOMY  2000s   not due to cancer; partial  . BACK SURGERY    . CERVICAL DISCECTOMY  2000s  . [place]; 1998  . ELECTROPHYSIOLOGIC STUDY N/A 02/11/2016   Procedure: A-Fluttter Ablation;  Surgeon: [name], MD;  Location: [facility];  Service: Cardiovascular;  Laterality: N/A;  . LEFT HEART CATHETERIZATION WITH CORONARY ANGIOGRAM N/A 06/12/2014   Procedure: LEFT HEART CATHETERIZATION WITH CORONARY ANGIOGRAM;  Surgeon: [name], MD;  Location: [facility];  Service: Cardiovascular;  Laterality: N/A;  . THYROIDECTOMY, PARTIAL Left    "pre-cancerous cells"    Current Outpatient Medications  Medication Sig Dispense Refill  . ARMOUR THYROID 120 MG tablet Take 120 mg by mouth daily.  1  . calcium carbonate (OS-CAL - DOSED IN MG OF ELEMENTAL CALCIUM) 1250 (500 Ca) MG tablet Take 2 tablets by mouth daily with breakfast.    . denosumab (PROLIA) 60 MG/ML SOLN injection Inject 60 mg into the skin every 6 (six) months. Administer in upper arm, thigh,  or abdomen    . diclofenac sodium (VOLTAREN) 1 % GEL Apply 1 g topically 3 (three) times daily. X 30 days  1  . ezetimibe (ZETIA) 10 MG tablet Take 10 mg by mouth daily.    . metFORMIN (GLUCOPHAGE-XR) 500 MG 24 hr tablet Take 500 mg by mouth daily.    . metoprolol succinate (TOPROL-XL) 50 MG 24 hr tablet Take 1 tablet (50 mg total) daily by mouth. Take with or immediately following a meal. . 90 tablet 1  . simvastatin (ZOCOR) 20 MG tablet Take 20 mg by mouth daily.    . Vitamin D, Ergocalciferol, (DRISDOL) 50000 units CAPS capsule Take 50,000 Units by mouth once a week. Thursday  5  . alendronate (FOSAMAX) 70 MG tablet Take 70 mg  by mouth once a week. Take with a full glass of water on an empty stomach.    Francia Greaves THYROID 60 MG tablet Take 60-120 mg by mouth See admin instructions. Alternate taking 1 tablet one day then take 2 tablets the next day    . ARMOUR THYROID 90 MG tablet Take 90 mg by mouth daily.  2   Current Facility-Administered Medications  Medication Dose Route Frequency Provider Last Rate Last Dose  . 0.9 %  sodium chloride infusion  500 mL Intravenous Continuous Nandigam, Kavitha V, MD      . 0.9 %  sodium chloride infusion  500 mL Intravenous Continuous Nandigam, Venia Minks, MD        Allergies:   Adhesive [tape]; Almond oil; and Latex   Social History:  The patient  reports that  has never smoked. she has never used smokeless tobacco. She reports that she does not drink alcohol or use drugs.   Family History:  The patient's family history includes Colon polyps in her father; Diabetes Mellitus I in her mother; Heart attack in her paternal uncle; Heart attack (age of onset: 30) in her cousin; Heart attack (age of onset: 64) in her paternal grandfather; Hyperlipidemia in her father; Hypertension in her father; Stroke (age of onset: 73) in her paternal aunt.  ROS:  Please see the history of present illness.   All other systems are reviewed and otherwise negative.   PHYSICAL EXAM:  VS:  BP (!) 144/72   Pulse 76   Ht 5\' 3"  (1.6 m)   Wt 169 lb 3.2 oz (76.7 kg)   BMI 29.97 kg/m  BMI: Body mass index is 29.97 kg/m. Well nourished, well developed, in no acute distress  HEENT: normocephalic, atraumatic  Neck: no JVD, carotid bruits or masses Cardiac:  RRR; no murmurs, no rubs, or gallops Lungs:  CTA b/l, no wheezing, rhonchi or rales  Abd: soft, nontender MS: no deformity or atrophy Ext: no edema  Skin: warm and dry, no rash Neuro:  No gross deficits appreciated Psych: euthymic mood, full affect    EKG:  Done 06/15/17 and reviewed by myself is SR, 71bpm, PR 16ms, QRS 29ms, QTc 470ms  07/25/17:  48 Hr holter 1. NSR with sinus bradycardia and sinus tachycardia 2. Rare PVC's 3. No sustained pauses, SVT or VT.  02/10/17: EPS/Ablation CONCLUSIONS:  1. Inducible, typical, isthmus dependent atrial flutter.  2. Spontaneous atrial fibrillation  3. Successful radiofrequency ablation of atrial flutter along the cavotricuspid isthmus with complete bidirectional isthmus block achieved.  4. No inducible arrhythmias following ablation.  5. No early apparent complications.   01/22/16: TTE Study Conclusions - Left ventricle: The cavity size was normal. Wall thickness was  normal. Systolic function was normal. The estimated ejection   fraction was in the range of 55% to 60%. Wall motion was normal;   there were no regional wall motion abnormalities. Left   ventricular diastolic function parameters were normal. - Aortic valve: There was trivial regurgitation.  Recent Labs: No results found for requested labs within last 8760 hours.  No results found for requested labs within last 8760 hours.   CrCl cannot be calculated (Patient's most recent lab result is older than the maximum 21 days allowed.).   Wt Readings from Last 3 Encounters:  08/25/17 169 lb 3.2 oz (76.7 kg)  06/15/17 170 lb 9.6 oz (77.4 kg)  05/11/16 164 lb (74.4 kg)     Other studies reviewed: Additional studies/records reviewed today include: summarized above  ASSESSMENT AND PLAN:  1. AFlutter ablated Aug 2017     CHA2DS2Vasc is 3, off a/c post ablation      C/o palpitations noted only rare PVCs by 48 monitor Palpitations have slowed and not bothersome Discussed walking/exercise  2. HTN     No changes today     Re-discussed exercise, weight loss and minimizing sodium.   Disposition: we can see her back in 1 year or PRN   Current medicines are reviewed at length with the patient today.  The patient did not have any concerns regarding medicines.  Venetia Night, PA-C 08/25/2017 2:56 PM     [place] [place] [address] [place] [address] [phone] (office)  [phone] (fax)

## 2017-08-25 NOTE — Patient Instructions (Signed)
Medication Instructions:   Your physician recommends that you continue on your current medications as directed. Please refer to the Current Medication list given to you today.   If you need a refill on your cardiac medications before your next appointment, please call your pharmacy.  Labwork: NONE ORDERED  TODAY    Testing/Procedures: NONE ORDERED  TODAY    Follow-Up:  Your physician wants you to follow-up in: [place] [phone] AS NEEDED FOR  ANY CARDIAC RELATED SYMPTOMS    Any Other Special Instructions Will Be Listed Below (If Applicable).

## 2017-10-28 ENCOUNTER — Other Ambulatory Visit: Payer: Self-pay

## 2017-10-28 ENCOUNTER — Ambulatory Visit (AMBULATORY_SURGERY_CENTER): Payer: Self-pay | Admitting: *Deleted

## 2017-10-28 VITALS — Ht 63.5 in | Wt 169.8 lb

## 2017-10-28 DIAGNOSIS — Z86010 Personal history of colonic polyps: Secondary | ICD-10-CM

## 2017-10-28 MED ORDER — NA SULFATE-K SULFATE-MG SULF 17.5-3.13-1.6 GM/177ML PO SOLN: ORAL | 0 refills | Status: DC

## 2017-10-28 NOTE — Progress Notes (Signed)
No egg or soy allergy  Declines Emmi  No trouble with intubation per pt; pt denies having PONV with last colonoscopy in 2017  No home oxygen used or hx of sleep apnea  No diet medications taken  $15 off Suprep coupon given

## 2017-11-02 ENCOUNTER — Encounter: Payer: Self-pay | Admitting: Gastroenterology

## 2017-11-11 ENCOUNTER — Encounter: Payer: Self-pay | Admitting: Gastroenterology

## 2017-11-11 ENCOUNTER — Ambulatory Visit (AMBULATORY_SURGERY_CENTER): Payer: BC Managed Care – PPO | Admitting: Gastroenterology

## 2017-11-11 ENCOUNTER — Other Ambulatory Visit: Payer: Self-pay

## 2017-11-11 VITALS — BP 160/79 | HR 64 | Temp 98.0°F | Resp 20 | Ht 63.5 in | Wt 169.0 lb

## 2017-11-11 DIAGNOSIS — D129 Benign neoplasm of anus and anal canal: Secondary | ICD-10-CM

## 2017-11-11 DIAGNOSIS — Z86010 Personal history of colonic polyps: Secondary | ICD-10-CM | POA: Diagnosis present

## 2017-11-11 DIAGNOSIS — D128 Benign neoplasm of rectum: Secondary | ICD-10-CM

## 2017-11-11 LAB — HM COLONOSCOPY

## 2017-11-11 MED ORDER — SODIUM CHLORIDE 0.9 % IV SOLN: 500.0000 mL | Freq: Once | INTRAVENOUS | Status: DC

## 2017-11-11 NOTE — Patient Instructions (Signed)
HANDOUTS GIVEN FOR POLYPS, DIVERTICULOSIS AND HEMORRHOIDS  YOU HAD AN ENDOSCOPIC PROCEDURE TODAY AT THE [facility]:   Refer to the procedure report that was given to you for any specific questions about what was found during the examination.  If the procedure report does not answer your questions, please call your gastroenterologist to clarify.  If you requested that your care partner not be given the details of your procedure findings, then the procedure report has been included in a sealed envelope for you to review at your convenience later.  YOU SHOULD EXPECT: Some feelings of bloating in the abdomen. Passage of more gas than usual.  Walking can help get rid of the air that was put into your GI tract during the procedure and reduce the bloating. If you had a lower endoscopy (such as a colonoscopy or flexible sigmoidoscopy) you may notice spotting of blood in your stool or on the toilet paper. If you underwent a bowel prep for your procedure, you may not have a normal bowel movement for a few days.  Please Note:  You might notice some irritation and congestion in your nose or some drainage.  This is from the oxygen used during your procedure.  There is no need for concern and it should clear up in a day or so.  SYMPTOMS TO REPORT IMMEDIATELY:   Following lower endoscopy (colonoscopy or flexible sigmoidoscopy):  Excessive amounts of blood in the stool  Significant tenderness or worsening of abdominal pains  Swelling of the abdomen that is new, acute  Fever of 100F or higher  For urgent or emergent issues, a gastroenterologist can be reached at any hour by calling [phone].   DIET:  We do recommend a small meal at first, but then you may proceed to your regular diet.  Drink plenty of fluids but you should avoid alcoholic beverages for 24 hours.  ACTIVITY:  You should plan to take it easy for the rest of today and you should NOT DRIVE or use heavy machinery until tomorrow  (because of the sedation medicines used during the test).    FOLLOW UP: Our staff will call the number listed on your records the next business day following your procedure to check on you and address any questions or concerns that you may have regarding the information given to you following your procedure. If we do not reach you, we will leave a message.  However, if you are feeling well and you are not experiencing any problems, there is no need to return our call.  We will assume that you have returned to your regular daily activities without incident.  If any biopsies were taken you will be contacted by phone or by letter within the next 1-3 weeks.  Please call us at [phone] if you have not heard about the biopsies in 3 weeks.    SIGNATURES/CONFIDENTIALITY: You and/or your care partner have signed paperwork which will be entered into your electronic medical record.  These signatures attest to the fact that that the information above on your After Visit Summary has been reviewed and is understood.  Full responsibility of the confidentiality of this discharge information lies with you and/or your care-partner. 

## 2017-11-11 NOTE — Progress Notes (Signed)
No changes in medical or surgical hx since PV per pt 

## 2017-11-11 NOTE — Progress Notes (Signed)
Called to room to assist during endoscopic procedure.  Patient ID and intended procedure confirmed with present staff. Received instructions for my participation in the procedure from the performing physician.  

## 2017-11-11 NOTE — Progress Notes (Signed)
Report to PACU, RN, vss, BBS= Clear.  

## 2017-11-11 NOTE — Op Note (Signed)
[facility] Patient Name: Claudia Shelton Procedure Date: 11/11/2017 8:03 AM MRN: [id number] Endoscopist: Mauri Pole , MD Age: 57 Referring MD:  Date of Birth: 1961-01-22 Gender: Female Account #: [id number] Procedure:                Colonoscopy Indications:              High risk colon cancer surveillance: Personal                            history of adenoma (10 mm or greater in size), High                            risk colon cancer surveillance: Personal history of                            multiple (3 or more) adenomas Medicines:                Monitored Anesthesia Care Procedure:                Pre-Anesthesia Assessment:                           - Prior to the procedure, a History and Physical                            was performed, and patient medications and                            allergies were reviewed. The patient's tolerance of                            previous anesthesia was also reviewed. The risks                            and benefits of the procedure and the sedation                            options and risks were discussed with the patient.                            All questions were answered, and informed consent                            was obtained. Prior Anticoagulants: The patient has                            taken no previous anticoagulant or antiplatelet                            agents. ASA Grade Assessment: II - A patient with                            mild systemic disease. After reviewing the risks  and benefits, the patient was deemed in                            satisfactory condition to undergo the procedure.                           After obtaining informed consent, the colonoscope                            was passed under direct vision. Throughout the                            procedure, the patient's blood pressure, pulse, and                            oxygen saturations were  monitored continuously. The                            Model PCF-[id number]) scope was introduced                            through the anus and advanced to the the cecum,                            identified by appendiceal orifice and ileocecal                            valve. The colonoscopy was performed without                            difficulty. The patient tolerated the procedure                            well. The quality of the bowel preparation was                            excellent. The ileocecal valve, appendiceal                            orifice, and rectum were photographed. Scope In: 8:07:40 AM Scope Out: 8:22:46 AM Scope Withdrawal Time: 0 hours 12 minutes 15 seconds  Total Procedure Duration: 0 hours 15 minutes 6 seconds  Findings:                 The perianal and digital rectal examinations were                            normal.                           Two sessile polyps were found in the rectum. The                            polyps were 3 to 4 mm in size. These polyps were  removed with a cold snare. Resection and retrieval                            were complete.                           A few small-mouthed diverticula were found in the                            sigmoid colon, descending colon, transverse colon                            and ascending colon.                           Non-bleeding internal hemorrhoids were found during                            retroflexion. The hemorrhoids were medium-sized.                           The exam was otherwise without abnormality. Complications:            No immediate complications. Estimated Blood Loss:     Estimated blood loss was minimal. Impression:               - Two 3 to 4 mm polyps in the rectum, removed with                            a cold snare. Resected and retrieved.                           - Diverticulosis in the sigmoid colon, in the                             descending colon, in the transverse colon and in                            the ascending colon.                           - Non-bleeding internal hemorrhoids.                           - The examination was otherwise normal. Recommendation:           - Patient has a contact number available for                            emergencies. The signs and symptoms of potential                            delayed complications were discussed with the                            patient. Return to normal activities tomorrow.  Written discharge instructions were provided to the                            patient.                           - Resume previous diet.                           - Continue present medications.                           - Await pathology results.                           - Repeat colonoscopy in 5 years for surveillance                            based on pathology results. Mauri Pole, MD 11/11/2017 8:27:47 AM This report has been signed electronically.

## 2017-11-14 ENCOUNTER — Telehealth: Payer: Self-pay

## 2017-11-14 NOTE — Telephone Encounter (Signed)
  Follow up Call-  Call back number 11/11/2017 05/11/2016  Post procedure Call Back phone  # [phone]  Permission to leave phone message Yes Yes  Some recent data might be hidden     Patient questions:  Do you have a fever, pain , or abdominal swelling? No. Pain Score  0 *  Have you tolerated food without any problems? Yes.    Have you been able to return to your normal activities? Yes.    Do you have any questions about your discharge instructions: Diet   No. Medications  No. Follow up visit  No.  Do you have questions or concerns about your Care? No.  Actions: * If pain score is 4 or above: No action needed, pain <4.

## 2017-11-16 ENCOUNTER — Encounter: Payer: Self-pay | Admitting: Gastroenterology

## 2018-08-09 ENCOUNTER — Other Ambulatory Visit: Payer: Self-pay | Admitting: Physician Assistant

## 2018-11-08 ENCOUNTER — Other Ambulatory Visit: Payer: Self-pay | Admitting: Physician Assistant

## 2019-08-04 ENCOUNTER — Other Ambulatory Visit: Payer: Self-pay | Admitting: Physician Assistant

## 2019-09-02 ENCOUNTER — Other Ambulatory Visit: Payer: Self-pay | Admitting: Physician Assistant

## 2019-09-15 ENCOUNTER — Other Ambulatory Visit: Payer: Self-pay | Admitting: Physician Assistant

## 2019-09-16 NOTE — Progress Notes (Signed)
Cardiology Office Note Date:  09/17/2019  Patient ID:  Claudia, Shelton 1960/07/30, MRN [id number] PCP:  Rochel Brome, MD  Cardiologist:  Dr. Lovena Le    Chief Complaint:  annual visit  History of Present Illness: Claudia Shelton is a 59 y.o. female with history of AFlutter ablated, HTN, Hypothyroidism, DM.    She come sin today to be seen for Dr. Lovena Le, last seen by him in Sept 2017.  At that time she was post ablation and doing well, her Eliquis stopped and low dose ASA started.  Chart notes report planned going forward for every other year/PRN follow ups if doing well.   I saw her in Jan 2019, generally reported she was feeling well, no CP, no dizziness, near syncope or syncope.  She mentioned palpitations, generally at least 2-3 x day, most days, a brief feeling of a quiver feeling, this was somewhat reminiscent of her AFlutter though shorter, often lasting only a few seconds.  The day prior to her appt she forgot to take her metoprolol as usual in the morning, and that night felt like her heart beat was unusual, not irregular, but somehow different and made her feel a little SOB.  No SOB otherwise.  She reported being active daily though no dedicated exercise, she denied any change to her exertional capacity, no difficulty with her ADLs. She was ordered for a 48 monitor given her daily symptoms, this noted SR with rare PVCs only.  I saw her again march 2019, she was doing well, palpitations were less and not bothersome.  Since her last visit with [place] her thyroid replacement had been increased, she haf f/u on that next month.  No CP or SOB, no dizziness, near syncope or syncope.  Not exercising but reports always on the move and active, no exertional intolerances.  Planned for annual/PRN visits  TODAY  She is doing well.  Over the last year less active then usual and put on a few pounds.  She mentions some DOE and chest discomfort with maximal exertion.  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  The 1st pary is uphill and gets winded with the same chest heaviness.   These are unchanged dating back to her LHC in 2015.  No change in behavior, not escalating in any way.  No symptoms with the last 1./2 of her walk that is down hill, none with daily activites or moderate exertion.  No dizziness, near syncope or syncope.  She has infrequent very fleeting quick beats/palpitations.  At a couple MD visits her BP has been about 150/90.  She sees her endocrinologist 2x a year and has labs including lipids done.  Past Medical History:  Diagnosis Date  . BMI 28.0-28.9,adult   . Chest pain on exertion 2015  . Heart murmur   . Hyperlipidemia   . Hypertension   . Hypothyroidism    half of thyroid removed d/t large goiter  . Osteoporosis   . Palpitations   . Pleuritic pain    right lung base  . PONV (postoperative nausea and vomiting)   . SOB (shortness of breath) on exertion 2015  . TMJ (dislocation of temporomandibular joint)    "not as bad as it was; very minor now" (02/11/2016)  . Type II diabetes mellitus ([place])    "they say I'm prediabetic but I'm on RX" (02/11/2016)    Past  Surgical History:  Procedure Laterality Date  . ABDOMINAL HYSTERECTOMY  2000s   not due to cancer; partial  . BACK SURGERY    . CERVICAL DISCECTOMY  2000s  . [place]; 1998  . COLONOSCOPY    . ELECTROPHYSIOLOGIC STUDY N/A 02/11/2016   Procedure: A-Fluttter Ablation;  Surgeon: [name], MD;  Location: [facility];  Service: Cardiovascular;  Laterality: N/A;  . LEFT HEART CATHETERIZATION WITH CORONARY ANGIOGRAM N/A 06/12/2014   Procedure: LEFT HEART CATHETERIZATION WITH CORONARY ANGIOGRAM;  Surgeon: [name], MD;  Location: [facility];  Service: Cardiovascular;  Laterality: N/A;  . THYROIDECTOMY, PARTIAL Left    "pre-cancerous cells"    Current Outpatient Medications   Medication Sig Dispense Refill  . ARMOUR THYROID 120 MG tablet Take 120 mg by mouth daily.  1  . calcium carbonate (OS-CAL - DOSED IN MG OF ELEMENTAL CALCIUM) 1250 (500 Ca) MG tablet Take 2 tablets by mouth daily with breakfast.    . denosumab (PROLIA) 60 MG/ML SOLN injection Inject 60 mg into the skin every 6 (six) months. Administer in upper arm, thigh, or abdomen    . ezetimibe (ZETIA) 10 MG tablet Take 10 mg by mouth daily.    . metFORMIN (GLUCOPHAGE-XR) 500 MG 24 hr tablet Take 500 mg by mouth daily.    . metoprolol succinate (TOPROL-XL) 50 MG 24 hr tablet TAKE 1 TABLET BY MOUTH EVERY DAY TAKE WITH OR IMMEDIATELY FOLLOWING A MEAL. 90 tablet 3  . simvastatin (ZOCOR) 20 MG tablet Take 20 mg by mouth daily.    . Vitamin D, Ergocalciferol, (DRISDOL) 50000 units CAPS capsule Take 50,000 Units by mouth once a week. Thursday  5   Current Facility-Administered Medications  Medication Dose Route Frequency Provider Last Rate Last Admin  . 0.9 %  sodium chloride infusion  500 mL Intravenous Continuous Nandigam, Kavitha V, MD      . 0.9 %  sodium chloride infusion  500 mL Intravenous Continuous Nandigam, Kavitha V, MD      . 0.9 %  sodium chloride infusion  500 mL Intravenous Once Nandigam, Venia Minks, MD        Allergies:   Adhesive [tape], Almond oil, and Latex   Social History:  The patient  reports that she has never smoked. She has never used smokeless tobacco. She reports that she does not drink alcohol or use drugs.   Family History:  The patient's family history includes Colon polyps in her father; Diabetes Mellitus I in her mother; Heart attack in her paternal uncle; Heart attack (age of onset: 28) in her cousin; Heart attack (age of onset: 103) in her paternal grandfather; Hyperlipidemia in her father; Hypertension in her father; Stroke (age of onset: 38) in her paternal aunt.  ROS:  Please see the history of present illness.   All other systems are reviewed and otherwise negative.    PHYSICAL EXAM:  VS:  BP 132/78   Pulse 67   Ht 5' 3.5" (1.613 m)   Wt 171 lb (77.6 kg)   BMI 29.82 kg/m  BMI: Body mass index is 29.82 kg/m. Well nourished, well developed, in no acute distress  HEENT: normocephalic, atraumatic  Neck: no JVD, carotid bruits or masses Cardiac: RRR; no murmurs, no rubs, or gallops Lungs:  CTA b/l, no wheezing, rhonchi or rales  Abd: soft, nontender MS: no deformity or atrophy Ext: no edema  Skin: warm and dry, no rash Neuro:  No gross  deficits appreciated Psych: euthymic mood, full affect    EKG:  Done today and reviewed by myself;  SR 67bpm, normal EKG, unchanged  07/25/17: 48 Hr holter 1. NSR with sinus bradycardia and sinus tachycardia 2. Rare PVC's 3. No sustained pauses, SVT or VT.  02/10/17: EPS/Ablation CONCLUSIONS:  1. Inducible, typical, isthmus dependent atrial flutter.  2. Spontaneous atrial fibrillation  3. Successful radiofrequency ablation of atrial flutter along the cavotricuspid isthmus with complete bidirectional isthmus block achieved.  4. No inducible arrhythmias following ablation.  5. No early apparent complications.   01/22/16: TTE Study Conclusions - Left ventricle: The cavity size was normal. Wall thickness was   normal. Systolic function was normal. The estimated ejection   fraction was in the range of 55% to 60%. Wall motion was normal;   there were no regional wall motion abnormalities. Left   ventricular diastolic function parameters were normal. - Aortic valve: There was trivial regurgitation.  06/12/2014: LHC IMPRESSIONS:  1. Normal coronary arteries 2. Hyperdynamic left ventricular systolic function. Chronic diastolic heart failure with elevated end-diastolic pressures. 3. Essential hypertension    Recent Labs: No results found for requested labs within last 8760 hours.  No results found for requested labs within last 8760 hours.   CrCl cannot be calculated (Patient's most recent lab result is  older than the maximum 21 days allowed.).   Wt Readings from Last 3 Encounters:  09/17/19 171 lb (77.6 kg)  11/11/17 169 lb (76.7 kg)  10/28/17 169 lb 12.8 oz (77 kg)     Other studies reviewed: Additional studies/records reviewed today include: summarized above  ASSESSMENT AND PLAN:  1. AFlutter ablated Aug 2017     CHA2DS2Vasc is 3, off a/c post ablation      2. HTN     No changes today     Re-discussed exercise, weight loss and minimizing sodium.     She has a home BP Cuff, is asked to check her BP a couple times a week or more if able to get an idea of how her BP is doing.  Let us know if routinely elevated  3. DOE/Chest discomfort     Unchanged from the time of her cath in 2015     She will let us know if any changes, escalation in behavior in any way   Disposition: continue annual visits, sooner if needed   Current medicines are reviewed at length with the patient today.  The patient did not have any concerns regarding medicines.  Venetia Night, PA-C 09/17/2019 4:01 PM     [place] [employer] [place] [address] [phone] (office)  [phone] (fax)

## 2019-09-17 ENCOUNTER — Other Ambulatory Visit: Payer: Self-pay

## 2019-09-17 ENCOUNTER — Ambulatory Visit: Payer: BC Managed Care – PPO | Admitting: Physician Assistant

## 2019-09-17 VITALS — BP 132/78 | HR 67 | Ht 63.5 in | Wt 171.0 lb

## 2019-09-17 DIAGNOSIS — I484 Atypical atrial flutter: Secondary | ICD-10-CM

## 2019-09-17 DIAGNOSIS — R0789 Other chest pain: Secondary | ICD-10-CM | POA: Diagnosis not present

## 2019-09-17 DIAGNOSIS — R002 Palpitations: Secondary | ICD-10-CM | POA: Diagnosis not present

## 2019-09-17 DIAGNOSIS — I10 Essential (primary) hypertension: Secondary | ICD-10-CM | POA: Diagnosis not present

## 2019-09-17 MED ORDER — METOPROLOL SUCCINATE ER 50 MG PO TB24: ORAL_TABLET | ORAL | 3 refills | Status: DC

## 2019-09-17 NOTE — Patient Instructions (Signed)

## 2019-11-29 ENCOUNTER — Encounter: Payer: Self-pay | Admitting: Family Medicine

## 2019-11-29 DIAGNOSIS — E039 Hypothyroidism, unspecified: Secondary | ICD-10-CM | POA: Insufficient documentation

## 2019-11-29 DIAGNOSIS — I10 Essential (primary) hypertension: Secondary | ICD-10-CM | POA: Insufficient documentation

## 2019-11-29 DIAGNOSIS — E785 Hyperlipidemia, unspecified: Secondary | ICD-10-CM | POA: Insufficient documentation

## 2019-11-29 NOTE — Progress Notes (Signed)
Acute Office Visit  Subjective:    Patient ID: Claudia Shelton, female    DOB: Nov 05, 1960, 59 y.o.   MRN: [id number]  Chief Complaint  Patient presents with  . throat pain    HPI Patient is in today for pain in rt side of neck. She is worried about thyroid nodule.  History of thyroidectomy (partial) on left. She saw Dr. Shelda Jakes, who reassured her that there was no nodule. She is still very concerned and would like an [place] of her neck.   Past Medical History:  Diagnosis Date  . Atrial fibrillation ([place])   . BMI 28.0-28.9,adult   . Chest pain on exertion 2015  . Heart murmur   . Hyperlipidemia   . Hypertension   . Hypothyroidism    half of thyroid removed d/t large goiter  . Impaired fasting glucose   . Osteoporosis   . Palpitations   . Pleuritic pain    right lung base  . PONV (postoperative nausea and vomiting)   . SOB (shortness of breath) on exertion 2015  . TMJ (dislocation of temporomandibular joint)    "not as bad as it was; very minor now" (02/11/2016)  . Type II diabetes mellitus ([place])    "they say I'm prediabetic but I'm on RX" (02/11/2016)    Past Surgical History:  Procedure Laterality Date  . ABDOMINAL HYSTERECTOMY  2000s   not due to cancer; partial  . BACK SURGERY    . CERVICAL DISCECTOMY  2000s  . [place]; 1998  . COLONOSCOPY    . ELECTROPHYSIOLOGIC STUDY N/A 02/11/2016   Procedure: A-Fluttter Ablation;  Surgeon: [name], MD;  Location: [facility];  Service: Cardiovascular;  Laterality: N/A;  . LEFT HEART CATHETERIZATION WITH CORONARY ANGIOGRAM N/A 06/12/2014   Procedure: LEFT HEART CATHETERIZATION WITH CORONARY ANGIOGRAM;  Surgeon: [name], MD;  Location[facility];  Service: Cardiovascular;  Laterality: N/A;  . THYROIDECTOMY, PARTIAL Left    "pre-cancerous cells"    Family History  Problem Relation Age of Onset  . Diabetes Mellitus I Mother   . Hypertension Father   . Hyperlipidemia Father   . Colon  polyps Father   . Heart attack Paternal Grandfather 53  . Stroke Paternal Aunt 59  . Heart attack Paternal Uncle   . Heart attack Cousin 84  . Colon cancer Neg Hx   . Rectal cancer Neg Hx   . Esophageal cancer Neg Hx   . Stomach cancer Neg Hx     Social History   Socioeconomic History  . Marital status: Married    Spouse name: Not on file  . Number of children: Not on file  . Years of education: Not on file  . Highest education level: Not on file  Occupational History  . Not on file  Tobacco Use  . Smoking status: Never Smoker  . Smokeless tobacco: Never Used  Vaping Use  . Vaping Use: Never used  Substance and Sexual Activity  . Alcohol use: No    Alcohol/week: 0.0 standard drinks  . Drug use: No  . Sexual activity: Yes    Birth control/protection: None, Surgical  Other Topics Concern  . Not on file  Social History Narrative  . Not on file   Social Determinants of Health   Financial Resource Strain:   . Difficulty of Paying Living Expenses:   Food Insecurity:   . Worried About Charity fundraiser in the Last Year:   .  Ran Out of Food in the Last Year:   Transportation Needs:   . Film/video editor (Medical):   Marland Kitchen Lack of Transportation (Non-Medical):   Physical Activity:   . Days of Exercise per Week:   . Minutes of Exercise per Session:   Stress:   . Feeling of Stress :   Social Connections:   . Frequency of Communication with Friends and Family:   . Frequency of Social Gatherings with Friends and Family:   . Attends Religious Services:   . Active Member of Clubs or Organizations:   . Attends Archivist Meetings:   Marland Kitchen Marital Status:   Intimate Partner Violence:   . Fear of Current or Ex-Partner:   . Emotionally Abused:   Marland Kitchen Physically Abused:   . Sexually Abused:     Outpatient Medications Prior to Visit  Medication Sig Dispense Refill  . thyroid (ARMOUR) 90 MG tablet Take 90 mg by mouth daily.    . calcium carbonate (OS-CAL - DOSED  IN MG OF ELEMENTAL CALCIUM) 1250 (500 Ca) MG tablet Take 2 tablets by mouth daily with breakfast.    . denosumab (PROLIA) 60 MG/ML SOLN injection Inject 60 mg into the skin every 6 (six) months. Administer in upper arm, thigh, or abdomen    . ezetimibe (ZETIA) 10 MG tablet Take 10 mg by mouth daily.    . metFORMIN (GLUCOPHAGE-XR) 500 MG 24 hr tablet Take 500 mg by mouth daily.    . metoprolol succinate (TOPROL-XL) 50 MG 24 hr tablet TAKE 1 TABLET BY MOUTH EVERY DAY TAKE WITH OR IMMEDIATELY FOLLOWING A MEAL. 90 tablet 3  . simvastatin (ZOCOR) 20 MG tablet Take 20 mg by mouth daily.    . Vitamin D, Ergocalciferol, (DRISDOL) 50000 units CAPS capsule Take 50,000 Units by mouth once a week. Thursday  5  . ARMOUR THYROID 120 MG tablet Take 120 mg by mouth daily.  1   Facility-Administered Medications Prior to Visit  Medication Dose Route Frequency Provider Last Rate Last Admin  . 0.9 %  sodium chloride infusion  500 mL Intravenous Continuous Nandigam, Kavitha V, MD      . 0.9 %  sodium chloride infusion  500 mL Intravenous Continuous Nandigam, Kavitha V, MD      . 0.9 %  sodium chloride infusion  500 mL Intravenous Once Nandigam, Venia Minks, MD        Allergies  Allergen Reactions  . Adhesive [Tape] Hives and Itching  . Almond Oil Itching and Swelling  . Latex Hives    Review of Systems  Constitutional: Negative for chills, fatigue and fever.  HENT: Negative for congestion, ear pain, rhinorrhea and sore throat.   Respiratory: Negative for cough and shortness of breath.   Cardiovascular: Negative for chest pain and palpitations.  Gastrointestinal: Negative for abdominal pain, constipation, diarrhea, nausea and vomiting.  Genitourinary: Negative for dysuria and urgency.  Musculoskeletal: Negative for back pain and myalgias.  Neurological: Negative for dizziness, weakness, light-headedness and headaches.  Psychiatric/Behavioral: Negative for dysphoric mood. The patient is not nervous/anxious.          Objective:    Physical Exam Vitals reviewed.  Constitutional:      Appearance: Normal appearance. She is normal weight.  Neck:     Comments: Thyroid nodule on right side.  Cardiovascular:     Rate and Rhythm: Normal rate and regular rhythm.     Pulses: Normal pulses.     Heart sounds: Normal heart sounds.  Pulmonary:     Effort: Pulmonary effort is normal. No respiratory distress.     Breath sounds: Normal breath sounds.  Abdominal:     General: Abdomen is flat. Bowel sounds are normal.     Palpations: Abdomen is soft.     Tenderness: There is no abdominal tenderness.  Neurological:     Mental Status: She is alert and oriented to person, place, and time.  Psychiatric:        Mood and Affect: Mood normal.        Behavior: Behavior normal.     BP (!) 146/98   Pulse 84   Temp (!) 97.1 F (36.2 C)   Resp 16   Ht 5\' 4"  (1.626 m)   Wt 171 lb 12.8 oz (77.9 kg)   BMI 29.49 kg/m  Wt Readings from Last 3 Encounters:  11/30/19 171 lb 12.8 oz (77.9 kg)  09/17/19 171 lb (77.6 kg)  11/11/17 169 lb (76.7 kg)    Health Maintenance Due  Topic Date Due  . Hepatitis C Screening  Never done  . COVID-19 Vaccine (1) Never done  . HIV Screening  Never done  . TETANUS/TDAP  Never done  . PAP SMEAR-Modifier  Never done  . MAMMOGRAM  Never done    There are no preventive care reminders to display for this patient.   Lab Results  Component Value Date   TSH 5.20 (H) 01/13/2016   Lab Results  Component Value Date   WBC 5.9 01/30/2016   HGB 14.6 01/30/2016   HCT 42.9 01/30/2016   MCV 88.3 01/30/2016   PLT 242 01/30/2016   Lab Results  Component Value Date   NA 139 01/30/2016   K 4.1 01/30/2016   CO2 24 01/30/2016   GLUCOSE 106 (H) 01/30/2016   BUN 16 01/30/2016   CREATININE 0.81 01/30/2016   CALCIUM 9.6 01/30/2016   ANIONGAP 13 01/11/2016   No results found for: CHOL No results found for: HDL No results found for: LDLCALC No results found for: TRIG No  results found for: CHOLHDL No results found for: HGBA1C     Assessment & Plan:  1. Thyroid nodule - US THYROID; Future  Orders Placed This Encounter  Procedures  . US THYROID     Follow-up: No follow-ups on file.  An After Visit Summary was printed and given to the patient.  Rochel Brome [facility] [phone]

## 2019-11-30 ENCOUNTER — Other Ambulatory Visit: Payer: Self-pay

## 2019-11-30 ENCOUNTER — Encounter: Payer: Self-pay | Admitting: Family Medicine

## 2019-11-30 ENCOUNTER — Ambulatory Visit: Payer: BC Managed Care – PPO | Admitting: Family Medicine

## 2019-11-30 VITALS — BP 146/98 | HR 84 | Temp 97.1°F | Resp 16 | Ht 64.0 in | Wt 171.8 lb

## 2019-11-30 DIAGNOSIS — E041 Nontoxic single thyroid nodule: Secondary | ICD-10-CM | POA: Diagnosis not present

## 2019-12-01 ENCOUNTER — Encounter: Payer: Self-pay | Admitting: Family Medicine

## 2019-12-20 ENCOUNTER — Other Ambulatory Visit: Payer: Self-pay

## 2019-12-20 DIAGNOSIS — E041 Nontoxic single thyroid nodule: Secondary | ICD-10-CM

## 2020-05-26 LAB — HM DEXA SCAN

## 2020-06-03 ENCOUNTER — Encounter: Payer: Self-pay | Admitting: Family Medicine

## 2020-06-10 ENCOUNTER — Telehealth: Payer: Self-pay

## 2020-06-10 LAB — HM MAMMOGRAPHY

## 2020-06-10 NOTE — Telephone Encounter (Signed)
Patient did received the results of Bone density and discussed the results with Dr Patrecia Pace. She will not take the next dose of prolia until March/2022 because she will have an oral surgery. She started a new vitamin supplements treatment from Oral surgerist.

## 2020-09-09 ENCOUNTER — Other Ambulatory Visit: Payer: Self-pay | Admitting: Physician Assistant

## 2020-09-24 ENCOUNTER — Telehealth: Payer: Self-pay

## 2020-09-24 ENCOUNTER — Other Ambulatory Visit: Payer: Self-pay | Admitting: Legal Medicine

## 2020-09-24 MED ORDER — MOLNUPIRAVIR 200 MG PO CAPS: 800.0000 mg | ORAL_CAPSULE | Freq: Two times a day (BID) | ORAL | 0 refills | Status: DC

## 2020-09-24 MED ORDER — PAXLOVID 20 X 150 MG & 10 X 100MG PO TBPK: 1.0000 | ORAL_TABLET | Freq: Two times a day (BID) | ORAL | 0 refills | Status: DC

## 2020-09-24 NOTE — Telephone Encounter (Signed)
Pt called stating pharmacist called and states what was sent in is 30% effective where another drug is 89% effective. Pt is requesting medication be switched to paxlovid.   Claudia Shelton 09/24/20 2:39 PM

## 2020-09-24 NOTE — Telephone Encounter (Signed)
Pt calling stating she tested positive for covid this morning at [facility]. She is requesting a script for "antibody pills" sent to pharmacy. States urgent care would not write for them as "5 in 100 needs it." Pt states she does not want to be one of the five. Please advise.  Claudia Shelton, [place] 09/24/20 10:11 AM

## 2020-09-24 NOTE — Telephone Encounter (Signed)
Molnupiavir sent to [facility], the only pharmacy I know that carries this lp

## 2020-09-24 NOTE — Telephone Encounter (Signed)
I already called in the paxlavid lp

## 2020-09-24 NOTE — Telephone Encounter (Signed)
The studies show equal efficacy against Covid and paxlovid has more side effects, I will call in if she wants. Where is she getting her information? lp

## 2020-09-24 NOTE — Telephone Encounter (Signed)
Called pt. Pt asked husband what he wants her to do. Husband states she will stay with what was already sent.   What was told in earlier phone call, pharmacist was giving information.   Claudia Shelton 09/24/20 2:58 PM

## 2020-09-26 NOTE — Telephone Encounter (Signed)
Patient was notified. Patient stated that she will not take it.

## 2020-10-03 ENCOUNTER — Ambulatory Visit: Payer: Self-pay | Admitting: Physician Assistant

## 2020-10-05 ENCOUNTER — Other Ambulatory Visit: Payer: Self-pay | Admitting: Physician Assistant

## 2020-10-20 NOTE — Progress Notes (Signed)
Cardiology Office Note Date:  10/27/2020  Patient ID:  Claudia, Shelton Mar 11, 1961, MRN [id number] PCP:  Rochel Brome, MD  Cardiologist:  Dr. Lovena Le    Chief Complaint:  annual visit  History of Present Illness: Claudia Shelton is a 60 y.o. female with history of AFlutter ablated, HTN, Hypothyroidism, DM.    She come sin today to be seen for Dr. Lovena Le, last seen by him in Sept 2017.  At that time she was post ablation and doing well, her Eliquis stopped and low dose ASA started.  Chart notes report planned going forward for every other year/PRN follow ups if doing well.   I saw her in Jan 2019, generally reported she was feeling well, no CP, no dizziness, near syncope or syncope.  She mentioned palpitations, generally at least 2-3 x day, most days, a brief feeling of a quiver feeling, this was somewhat reminiscent of her AFlutter though shorter, often lasting only a few seconds.  The day prior to her appt she forgot to take her metoprolol as usual in the morning, and that night felt like her heart beat was unusual, not irregular, but somehow different and made her feel a little SOB.  No SOB otherwise.  She reported being active daily though no dedicated exercise, she denied any change to her exertional capacity, no difficulty with her ADLs. She was ordered for a 48 monitor given her daily symptoms, this noted SR with rare PVCs only.  I saw her again march 2019, she was doing well, palpitations were less and not bothersome.  Since her last visit with [place] her thyroid replacement had been increased, she had f/u on that next month.  No CP or SOB, no dizziness, near syncope or syncope.  Not exercising but reports always on the move and active, no exertional intolerances.  Planned for annual/PRN visits  I saw her 09/17/19 She is doing well.  Over the last year less active then usual and put on a few pounds.  She mentions some DOE and chest discomfort with maximal exertion.  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  The 1st part is uphill and gets winded with the same chest heaviness.   These are unchanged dating back to her LHC in 2015.  No change in behavior, not escalating in any way. No symptoms with the last 1./2 of her walk that is down hill, none with daily activites or moderate exertion. No dizziness, near syncope or syncope.  She has infrequent very fleeting quick beats/palpitations. At a couple MD visits her BP has been about 150/90.  She sees her endocrinologist 2x a year and has labs including lipids done. She was asked to monitor her BP at home and notify if persistently elevated Encouraged exercise, weight loss CP was chronic and unchanged back to the time of her cath, no changes made.  TODAY She is doing well over all Unfortunately had COVID in March, had a terrible cough, never really felt SOB, did lose taste/smell, these have yet to come back completely She had at the end of her COVID illness a couple times, maybe 3 of a feeling of something squeezing her heart, just for a second or two, no associated symptoms, and not again.  She has infrequent and brief palpitations, did have one episode this AM last a few seconds, very fast and makes her take in a deep  breath.  These are rare.  She is not exercising, but active at work in [employer], managing the books, often carrying a box of books, feels like she has good exertional capacity, no CP or unusual SOB.  NO dizzy spells, near syncope or syncope.  She follows regularly with her PMD and endocrinologist. Last HBG A1c was about 5.8 and lipids were reported as much improved.  Past Medical History:  Diagnosis Date  . Atrial fibrillation ([place])   . BMI 28.0-28.9,adult   . Chest pain on exertion 2015  . Heart murmur   . Hyperlipidemia   . Hypertension   . Hypothyroidism    half of thyroid removed d/t large goiter  . Impaired  fasting glucose   . Osteoporosis   . Palpitations   . Pleuritic pain    right lung base  . PONV (postoperative nausea and vomiting)   . SOB (shortness of breath) on exertion 2015  . TMJ (dislocation of temporomandibular joint)    "not as bad as it was; very minor now" (02/11/2016)  . Type II diabetes mellitus ([place])    "they say I'm prediabetic but I'm on RX" (02/11/2016)    Past Surgical History:  Procedure Laterality Date  . ABDOMINAL HYSTERECTOMY  2000s   not due to cancer; partial  . BACK SURGERY    . CERVICAL DISCECTOMY  2000s  . [place]; 1998  . COLONOSCOPY    . ELECTROPHYSIOLOGIC STUDY N/A 02/11/2016   Procedure: A-Fluttter Ablation;  Surgeon: [name], MD;  Location: [place] [facility];  Service: Cardiovascular;  Laterality: N/A;  . LEFT HEART CATHETERIZATION WITH CORONARY ANGIOGRAM N/A 06/12/2014   Procedure: LEFT HEART CATHETERIZATION WITH CORONARY ANGIOGRAM;  Surgeon: [name], MD;  Location: [facility];  Service: Cardiovascular;  Laterality: N/A;  . THYROIDECTOMY, PARTIAL Left    "pre-cancerous cells"    Current Outpatient Medications  Medication Sig Dispense Refill  . calcium carbonate (OS-CAL - DOSED IN MG OF ELEMENTAL CALCIUM) 1250 (500 Ca) MG tablet Take 2 tablets by mouth daily with breakfast.    . ezetimibe (ZETIA) 10 MG tablet Take 10 mg by mouth daily.    . metFORMIN (GLUCOPHAGE-XR) 500 MG 24 hr tablet Take 500 mg by mouth daily.    . metoprolol succinate (TOPROL-XL) 50 MG 24 hr tablet 1 TABLET BY MOUTH EVERY DAY TAKE IMMEDIATELY FOLLOWING A MEAL 30 tablet 0  . simvastatin (ZOCOR) 20 MG tablet Take 20 mg by mouth daily.    Marland Kitchen thyroid (ARMOUR) 90 MG tablet Take 90 mg by mouth daily.    . Vitamin D, Ergocalciferol, (DRISDOL) 50000 units CAPS capsule Take 50,000 Units by mouth once a week. Thursday  5   Current Facility-Administered Medications  Medication Dose Route Frequency Provider Last Rate Last Admin  . 0.9 %  sodium chloride  infusion  500 mL Intravenous Continuous Nandigam, Kavitha V, MD      . 0.9 %  sodium chloride infusion  500 mL Intravenous Continuous Nandigam, Kavitha V, MD      . 0.9 %  sodium chloride infusion  500 mL Intravenous Once Nandigam, Venia Minks, MD        Allergies:   Adhesive [tape], Almond oil, and Latex   Social History:  The patient  reports that she has never smoked. She has never used smokeless tobacco. She reports that she does not drink alcohol and does not use drugs.   Family History:  The patient's family  history includes Colon polyps in her father; Diabetes Mellitus I in her mother; Heart attack in her paternal uncle; Heart attack (age of onset: 90) in her cousin; Heart attack (age of onset: 16) in her paternal grandfather; Hyperlipidemia in her father; Hypertension in her father; Stroke (age of onset: 9) in her paternal aunt.  ROS:  Please see the history of present illness.   All other systems are reviewed and otherwise negative.   PHYSICAL EXAM:  VS:  BP (!) 150/60   Pulse 77   Ht 5\' 4"  (1.626 m)   Wt 172 lb 6.4 oz (78.2 kg)   SpO2 98%   BMI 29.59 kg/m  BMI: Body mass index is 29.59 kg/m. Well nourished, well developed, in no acute distress  HEENT: normocephalic, atraumatic  Neck: no JVD, carotid bruits or masses Cardiac: RRR; no murmurs, no rubs, or gallops Lungs:  CTA b/l, no wheezing, rhonchi or rales  Abd: soft, nontender MS: no deformity or atrophy Ext:  no edema  Skin: warm and dry, no rash Neuro:  No gross deficits appreciated Psych: euthymic mood, full affect    EKG:  Done today and reviewed by myself;  SR 77bpm, no ischemic or acute changes from prior   07/25/17: 48 Hr holter 1. NSR with sinus bradycardia and sinus tachycardia 2. Rare PVC's 3. No sustained pauses, SVT or VT.  02/10/17: EPS/Ablation CONCLUSIONS:  1. Inducible, typical, isthmus dependent atrial flutter.  2. Spontaneous atrial fibrillation  3. Successful radiofrequency ablation of  atrial flutter along the cavotricuspid isthmus with complete bidirectional isthmus block achieved.  4. No inducible arrhythmias following ablation.  5. No early apparent complications.   01/22/16: TTE Study Conclusions - Left ventricle: The cavity size was normal. Wall thickness was   normal. Systolic function was normal. The estimated ejection   fraction was in the range of 55% to 60%. Wall motion was normal;   there were no regional wall motion abnormalities. Left   ventricular diastolic function parameters were normal. - Aortic valve: There was trivial regurgitation.  06/12/2014: LHC IMPRESSIONS:  1. Normal coronary arteries 2. Hyperdynamic left ventricular systolic function. Chronic diastolic heart failure with elevated end-diastolic pressures. 3. Essential hypertension    Recent Labs: No results found for requested labs within last 8760 hours.  No results found for requested labs within last 8760 hours.   CrCl cannot be calculated (Patient's most recent lab result is older than the maximum 21 days allowed.).   Wt Readings from Last 3 Encounters:  10/27/20 172 lb 6.4 oz (78.2 kg)  11/30/19 171 lb 12.8 oz (77.9 kg)  09/17/19 171 lb (77.6 kg)     Other studies reviewed: Additional studies/records reviewed today include: summarized above  ASSESSMENT AND PLAN:  1. AFlutter ablated Aug 2017     CHA2DS2Vasc is 3, off a/c post ablation     Rare fleeting palpitations, no frequent or persistent palpitations     I have asked her to let us know if her palpitations increase in frequency or duration or symptomatic and we can plan monitoring      2. HTN     Elevated again today     She does not check at home but can and will     Discussed importance of BP control, she will let us know if regularaly 140/90 or higher  3 s/p COVID    Had an unusual squeeze type sensation    No anginal or inflammatory sounding symptoms   I will update  her echo given some palpitations, HTN and  COVID  Disposition: continue annual visits, sooner if needed   Current medicines are reviewed at length with the patient today.  The patient did not have any concerns regarding medicines.  Venetia Night, PA-C 10/27/2020 8:37 AM     [place] [address] [phone] (office)  [phone] (fax)

## 2020-10-27 ENCOUNTER — Encounter: Payer: Self-pay | Admitting: Physician Assistant

## 2020-10-27 ENCOUNTER — Other Ambulatory Visit: Payer: Self-pay

## 2020-10-27 ENCOUNTER — Ambulatory Visit (INDEPENDENT_AMBULATORY_CARE_PROVIDER_SITE_OTHER): Payer: BC Managed Care – PPO | Admitting: Physician Assistant

## 2020-10-27 VITALS — BP 150/60 | HR 77 | Ht 64.0 in | Wt 172.4 lb

## 2020-10-27 DIAGNOSIS — R0789 Other chest pain: Secondary | ICD-10-CM | POA: Diagnosis not present

## 2020-10-27 DIAGNOSIS — I483 Typical atrial flutter: Secondary | ICD-10-CM | POA: Diagnosis not present

## 2020-10-27 DIAGNOSIS — R002 Palpitations: Secondary | ICD-10-CM | POA: Diagnosis not present

## 2020-10-27 DIAGNOSIS — I10 Essential (primary) hypertension: Secondary | ICD-10-CM | POA: Diagnosis not present

## 2020-10-27 NOTE — Patient Instructions (Signed)
Medication Instructions:  Your physician recommends that you continue on your current medications as directed. Please refer to the Current Medication list given to you today.  *If you need a refill on your cardiac medications before your next appointment, please call your pharmacy*   Lab Work: NONE If you have labs (blood work) drawn today and your tests are completely normal, you will receive your results only by: Marland Kitchen MyChart Message (if you have MyChart) OR . A paper copy in the mail If you have any lab test that is abnormal or we need to change your treatment, we will call you to review the results.   Testing/Procedures: Your physician has requested that you have an echocardiogram. Echocardiography is a painless test that uses sound waves to create images of your heart. It provides your doctor with information about the size and shape of your heart and how well your heart's chambers and valves are working. This procedure takes approximately one hour. There are no restrictions for this procedure.    Follow-Up: At [facility], you and your health needs are our priority.  As part of our continuing mission to provide you with exceptional heart care, we have created designated Provider Care Teams.  These Care Teams include your primary Cardiologist (physician) and Advanced Practice Providers (APPs -  Physician Assistants and Nurse Practitioners) who all work together to provide you with the care you need, when you need it.  We recommend signing up for the patient portal called "MyChart".  Sign up information is provided on this After Visit Summary.  MyChart is used to connect with patients for Virtual Visits (Telemedicine).  Patients are able to view lab/test results, encounter notes, upcoming appointments, etc.  Non-urgent messages can be sent to your provider as well.   To learn more about what you can do with MyChart, go to [email or web address].    Your next appointment:   12  month(s)  The format for your next appointment:   In Person  Provider:   You may see DR. Lovena Le or one of the following Advanced Practice Providers on your designated Care Team:    Chanetta Marshall, NP  Tommye Standard, PA-C  Legrand Como "Jonni Sanger" [place], [place]    Other Instructions Please monitor your blood pressure at home and notify our office it  >140/90 regularly.

## 2020-11-05 ENCOUNTER — Other Ambulatory Visit: Payer: Self-pay | Admitting: Physician Assistant

## 2020-11-13 LAB — HM PAP SMEAR: HPV, high-risk: NEGATIVE

## 2020-11-21 ENCOUNTER — Ambulatory Visit (HOSPITAL_COMMUNITY): Payer: BC Managed Care – PPO | Attending: Cardiology

## 2020-11-21 ENCOUNTER — Other Ambulatory Visit: Payer: Self-pay

## 2020-11-21 DIAGNOSIS — R002 Palpitations: Secondary | ICD-10-CM | POA: Insufficient documentation

## 2020-11-21 DIAGNOSIS — R0789 Other chest pain: Secondary | ICD-10-CM | POA: Diagnosis present

## 2020-11-21 LAB — ECHOCARDIOGRAM COMPLETE
Area-P 1/2: 2.48 cm2
S' Lateral: 2.5 cm

## 2020-11-21 NOTE — Progress Notes (Signed)
Patient ID: Claudia Shelton, female   DOB: 11-03-1960, 60 y.o.   MRN: [id number]  Echocardiogram 2D Echocardiogram has been performed.  Jennette Dubin 11/21/20

## 2020-12-06 ENCOUNTER — Other Ambulatory Visit: Payer: Self-pay | Admitting: Cardiology

## 2020-12-08 NOTE — Telephone Encounter (Signed)
Rx(s) sent to pharmacy electronically.  

## 2021-07-19 ENCOUNTER — Other Ambulatory Visit: Payer: Self-pay | Admitting: Internal Medicine

## 2021-09-02 ENCOUNTER — Other Ambulatory Visit: Payer: Self-pay

## 2021-09-02 ENCOUNTER — Encounter: Payer: Self-pay | Admitting: Nurse Practitioner

## 2021-09-02 ENCOUNTER — Ambulatory Visit: Payer: BC Managed Care – PPO | Admitting: Nurse Practitioner

## 2021-09-02 VITALS — BP 150/76 | HR 82 | Temp 97.6°F | Ht 64.0 in | Wt 171.8 lb

## 2021-09-02 DIAGNOSIS — N3001 Acute cystitis with hematuria: Secondary | ICD-10-CM

## 2021-09-02 LAB — POCT URINALYSIS DIP (CLINITEK)
Bilirubin, UA: NEGATIVE
Glucose, UA: NEGATIVE mg/dL
Nitrite, UA: NEGATIVE
POC PROTEIN,UA: 100 — AB
Spec Grav, UA: >=1.03 — AB (ref 1.010–1.025)
Urobilinogen, UA: 0.2 E.U./dL
pH, UA: 5.5 (ref 5.0–8.0)

## 2021-09-02 MED ORDER — NITROFURANTOIN MONOHYD MACRO 100 MG PO CAPS: 100.0000 mg | ORAL_CAPSULE | Freq: Two times a day (BID) | ORAL | 0 refills | Status: DC

## 2021-09-02 NOTE — Progress Notes (Signed)
? ?Acute Office Visit ? ?Subjective:  ? ? Patient ID: Claudia Shelton, female    DOB: September 15, 1960, 61 y.o.   MRN: [id number] ? ?CC: ?Urinary frequency ? ?HPI: ? ?Urinary symptoms ? ?She reports new onset urinary urgency. The current episode started this morning and is worsening. Patient states symptoms are 1/10 in intensity, occurring constantly. She  has not been recently treated for similar symptoms.  ?  ?Associated symptoms: ?No abdominal pain No back pain  ?No chills No constipation  ?No cramping No diarrhea  ?No discharge No fever  ?No hematuria No nausea  ?No vomiting   ? ?  ? ?Past Medical History:  ?Diagnosis Date  ? Atrial fibrillation ([place])   ? BMI 28.0-28.9,adult   ? Chest pain on exertion 2015  ? Heart murmur   ? Hyperlipidemia   ? Hypertension   ? Hypothyroidism   ? 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 Impaired fasting glucose   ? Osteoporosis   ? Palpitations   ? Pleuritic pain   ? right lung base  ? PONV (postoperative nausea and vomiting)   ? SOB (shortness of breath) on exertion 2015  ? TMJ (dislocation of temporomandibular joint)   ? "not as bad as it was; very minor now" (02/11/2016)  ? Type II diabetes mellitus ([place])   ? "they say I'm prediabetic but I'm on RX" (02/11/2016)  ? ? ?Past Surgical History:  ?Procedure Laterality Date  ? ABDOMINAL HYSTERECTOMY  2000s  ? not due to cancer; partial  ? BACK SURGERY    ? CERVICAL DISCECTOMY  2000s  ? [place]; 1998  ? COLONOSCOPY    ? ELECTROPHYSIOLOGIC STUDY N/A 02/11/2016  ? Procedure: A-Fluttter Ablation;  Surgeon: [name], MD;  Location: [place] [facility] LAB;  Service: Cardiovascular;  Laterality: N/A;  ? LEFT HEART CATHETERIZATION WITH CORONARY ANGIOGRAM N/A 06/12/2014  ? Procedure: LEFT HEART CATHETERIZATION WITH CORONARY ANGIOGRAM;  Surgeon: [name], MD;  Location: [facility] CATH LAB;  Service: Cardiovascular;  Laterality: N/A;  ? THYROIDECTOMY, PARTIAL Left   ? "pre-cancerous cells"  ? ? ?Family History  ?Problem Relation Age  of Onset  ? Diabetes Mellitus I Mother   ? Hypertension Father   ? Hyperlipidemia Father   ? Colon polyps Father   ? Heart attack Paternal Grandfather 33  ? Stroke Paternal Aunt 27  ? Heart attack Paternal Uncle   ? Heart attack Cousin 60  ? Colon cancer Neg Hx   ? Rectal cancer Neg Hx   ? Esophageal cancer Neg Hx   ? Stomach cancer Neg Hx   ? ? ?Social History  ? ?Socioeconomic History  ? Marital status: Married  ?  Spouse name: Not on file  ? Number of children: Not on file  ? Years of education: Not on file  ? Highest education level: Not on file  ?Occupational History  ? Not on file  ?Tobacco Use  ? Smoking status: Never  ? Smokeless tobacco: Never  ?Vaping Use  ? Vaping Use: Never used  ?Substance and Sexual Activity  ? Alcohol use: No  ?  Alcohol/week: 0.0 standard drinks  ? Drug use: No  ? Sexual activity: Yes  ?  Birth control/protection: None, Surgical  ?Other Topics Concern  ? Not on file  ?Social History Narrative  ? Not on file  ? ?Social Determinants of Health  ? ?Financial Resource Strain: Not on file  ?Food Insecurity: Not on file  ?  Transportation Needs: Not on file  ?Physical Activity: Not on file  ?Stress: Not on file  ?Social Connections: Not on file  ?Intimate Partner Violence: Not on file  ? ? ?Outpatient Medications Prior to Visit  ?Medication Sig Dispense Refill  ? calcium carbonate (OS-CAL - DOSED IN MG OF ELEMENTAL CALCIUM) 1250 (500 Ca) MG tablet Take 2 tablets by mouth daily with breakfast.    ? ezetimibe (ZETIA) 10 MG tablet Take 10 mg by mouth daily.    ? metFORMIN (GLUCOPHAGE-XR) 500 MG 24 hr tablet Take 500 mg by mouth daily.    ? metoprolol succinate (TOPROL-XL) 50 MG 24 hr tablet TAKE 1 TABLET BY MOUTH EVERY DAY (TAKE IMMEDIATELY FOLLOWING A MEAL). Please make yearly appt with Dr. Lovena Le for May 2023 for future refills. Thank you 1st attempt 90 tablet 0  ? simvastatin (ZOCOR) 20 MG tablet Take 20 mg by mouth daily.    ? thyroid (ARMOUR) 90 MG tablet Take 90 mg by mouth daily.    ?  Vitamin D, Ergocalciferol, (DRISDOL) 50000 units CAPS capsule Take 50,000 Units by mouth once a week. Thursday  5  ? ?Facility-Administered Medications Prior to Visit  ?Medication Dose Route Frequency Provider Last Rate Last Admin  ? 0.9 %  sodium chloride infusion  500 mL Intravenous Continuous Nandigam, Kavitha V, MD      ? 0.9 %  sodium chloride infusion  500 mL Intravenous Continuous Nandigam, Kavitha V, MD      ? 0.9 %  sodium chloride infusion  500 mL Intravenous Once Nandigam, Venia Minks, MD      ? ? ?Allergies  ?Allergen Reactions  ? Adhesive [Tape] Hives and Itching  ? Almond Oil Itching and Swelling  ? Latex Hives  ? ? ?Review of Systems  ?Constitutional:  Negative for appetite change, chills, fatigue and fever.  ?HENT:  Negative for congestion, ear discharge, ear pain, rhinorrhea, sinus pressure, sneezing and sore throat.   ?Eyes:  Negative for visual disturbance.  ?Respiratory:  Negative for cough, chest tightness, shortness of breath and wheezing.   ?Cardiovascular:  Negative for chest pain, palpitations and leg swelling.  ?Gastrointestinal:  Negative for abdominal pain, diarrhea, nausea and vomiting.  ?Endocrine: Negative for polydipsia, polyphagia and polyuria.  ?Genitourinary:  Positive for dysuria, frequency, hematuria and urgency. Negative for difficulty urinating, menstrual problem, vaginal bleeding, vaginal discharge and vaginal pain.  ?Musculoskeletal:  Negative for back pain, gait problem, joint swelling, myalgias and neck pain.  ?Neurological:  Negative for dizziness, seizures, syncope, weakness, numbness and headaches.  ?Psychiatric/Behavioral:  Negative for agitation, confusion, hallucinations, sleep disturbance and suicidal ideas. The patient is not nervous/anxious.   ? ?   ?Objective:  ?  ?Physical Exam ?Vitals reviewed.  ?Abdominal:  ?   Tenderness: There is no right CVA tenderness or left CVA tenderness.  ?Skin: ?   General: Skin is warm and dry.  ?   Capillary Refill: Capillary refill  takes less than 2 seconds.  ?Neurological:  ?   General: No focal deficit present.  ?   Mental Status: She is alert and oriented to person, place, and time.  ?Psychiatric:     ?   Mood and Affect: Mood normal.     ?   Behavior: Behavior normal.  ? ? ?BP (!) 150/76   Pulse 82   Temp 97.6 ?F (36.4 ?C)   Ht '5\' 4"'$  (1.626 m)   Wt 171 lb 12.8 oz (77.9 kg)   SpO2 98%  BMI 29.49 kg/m?   ?Wt Readings from Last 3 Encounters:  ?10/27/20 172 lb 6.4 oz (78.2 kg)  ?11/30/19 171 lb 12.8 oz (77.9 kg)  ?09/17/19 171 lb (77.6 kg)  ? ? ?Health Maintenance Due  ?Topic Date Due  ? COVID-19 Vaccine (1) Never done  ? HIV Screening  Never done  ? Hepatitis C Screening  Never done  ? TETANUS/TDAP  Never done  ? Zoster Vaccines- Shingrix (1 of 2) Never done  ? INFLUENZA VACCINE  Never done  ? ? ?Lab Results  ?Component Value Date  ? TSH 5.20 (H) 01/13/2016  ? ?Lab Results  ?Component Value Date  ? WBC 5.9 01/30/2016  ? HGB 14.6 01/30/2016  ? HCT 42.9 01/30/2016  ? MCV 88.3 01/30/2016  ? PLT 242 01/30/2016  ? ?Lab Results  ?Component Value Date  ? NA 139 01/30/2016  ? K 4.1 01/30/2016  ? CO2 24 01/30/2016  ? GLUCOSE 106 (H) 01/30/2016  ? BUN 16 01/30/2016  ? CREATININE 0.81 01/30/2016  ? CALCIUM 9.6 01/30/2016  ? ANIONGAP 13 01/11/2016  ? ? ? ?Assessment & Plan:  ? ?1. Acute cystitis with hematuria ?- POCT URINALYSIS DIP (CLINITEK) ?- Urine Culture ?- nitrofurantoin, macrocrystal-monohydrate, (MACROBID) 100 MG capsule; Take 1 capsule (100 mg total) by mouth 2 (two) times daily.  Dispense: 14 capsule; Refill: 0 ?  ? ? ?Take Macrobid twice daily for 7 days ?Push fluids, especially water ?Avoid holding your urine ?Notify office immediately if symptoms worsen or fail to improve ? ?Follow-up: PRN ? ?An After Visit Summary was printed and given to the patient. ? ?I, Rip Harbour, NP, have reviewed all documentation for this visit. The documentation on 09/02/21 for the exam, diagnosis, procedures, and orders are all accurate and complete.   ? ? ?Signed, ?Rip Harbour, NP ?[place] ?([phone] ?

## 2021-09-02 NOTE — Patient Instructions (Signed)
Take Macrobid twice daily for 7 days ?Push fluids, especially water ?Avoid holding your urine ?Notify office immediately if symptoms worsen or fail to improve ? ? ? ?Urinary Tract Infection, Adult ?A urinary tract infection (UTI) is an infection of any part of the urinary tract. The urinary tract includes: ?The kidneys. ?The ureters. ?The bladder. ?The urethra. ?These organs make, store, and get rid of pee (urine) in the body. ?What are the causes? ?This infection is caused by germs (bacteria) in your genital area. These germs grow and cause swelling (inflammation) of your urinary tract. ?What increases the risk? ?The following factors may make you more likely to develop this condition: ?Using a small, thin tube (catheter) to drain pee. ?Not being able to control when you pee or poop (incontinence). ?Being female. If you are female, these things can increase the risk: ?Using these methods to prevent pregnancy: ?A medicine that kills sperm (spermicide). ?A device that blocks sperm (diaphragm). ?Having low levels of a female hormone (estrogen). ?Being pregnant. ?You are more likely to develop this condition if: ?You have genes that add to your risk. ?You are sexually active. ?You take antibiotic medicines. ?You have trouble peeing because of: ?A prostate that is bigger than normal, if you are female. ?A blockage in the part of your body that drains pee from the bladder. ?A kidney stone. ?A nerve condition that affects your bladder. ?Not getting enough to drink. ?Not peeing often enough. ?You have other conditions, such as: ?Diabetes. ?A weak disease-fighting system (immune system). ?Sickle cell disease. ?Gout. ?Injury of the spine. ?What are the signs or symptoms? ?Symptoms of this condition include: ?Needing to pee right away. ?Peeing small amounts often. ?Pain or burning when peeing. ?Blood in the pee. ?Pee that smells bad or not like normal. ?Trouble peeing. ?Pee that is cloudy. ?Fluid coming from the vagina, if you  are female. ?Pain in the belly or lower back. ?Other symptoms include: ?Vomiting. ?Not feeling hungry. ?Feeling mixed up (confused). This may be the first symptom in older adults. ?Being tired and grouchy (irritable). ?A fever. ?Watery poop (diarrhea). ?How is this treated? ?Taking antibiotic medicine. ?Taking other medicines. ?Drinking enough water. ?In some cases, you may need to see a specialist. ?Follow these instructions at home: ?Medicines ?Take over-the-counter and prescription medicines only as told by your doctor. ?If you were prescribed an antibiotic medicine, take it as told by your doctor. Do not stop taking it even if you start to feel better. ?General instructions ?Make sure you: ?Pee until your bladder is empty. ?Do not hold pee for a long time. ?Empty your bladder after sex. ?Wipe from front to back after peeing or pooping if you are a female. Use each tissue one time when you wipe. ?Drink enough fluid to keep your pee pale yellow. ?Keep all follow-up visits. ?Contact a doctor if: ?You do not get better after 1-2 days. ?Your symptoms go away and then come back. ?Get help right away if: ?You have very bad back pain. ?You have very bad pain in your lower belly. ?You have a fever. ?You have chills. ?You feeling like you will vomit or you vomit. ?Summary ?A urinary tract infection (UTI) is an infection of any part of the urinary tract. ?This condition is caused by germs in your genital area. ?There are many risk factors for a UTI. ?Treatment includes antibiotic medicines. ?Drink enough fluid to keep your pee pale yellow. ?This information is not intended to replace advice given to you by  your health care provider. Make sure you discuss any questions you have with your health care provider. ?Document Revised: 01/11/2020 Document Reviewed: 01/11/2020 ?Elsevier Patient Education ? [place]. ? ?

## 2021-09-05 LAB — URINE CULTURE

## 2021-09-11 ENCOUNTER — Ambulatory Visit: Payer: BC Managed Care – PPO | Admitting: Family Medicine

## 2021-09-11 VITALS — BP 140/78 | HR 60 | Temp 96.9°F | Resp 16 | Ht 64.0 in | Wt 169.6 lb

## 2021-09-11 DIAGNOSIS — H1033 Unspecified acute conjunctivitis, bilateral: Secondary | ICD-10-CM | POA: Insufficient documentation

## 2021-09-11 MED ORDER — CIPROFLOXACIN HCL 0.3 % OP SOLN: 1.0000 [drp] | OPHTHALMIC | 0 refills | Status: DC

## 2021-09-11 NOTE — Progress Notes (Signed)
? ?Acute Office Visit ? ?Subjective:  ? ? Patient ID: Claudia Shelton, female    DOB: [birth year], 61 y.o.   MRN: [id number] ? ?Chief Complaint  ?Patient presents with  ? Eye Drainage  ? ? ?HPI: ?Patient is in today for swelling and itching both eyes which started 2 days ago.  She had mattering of the eye this am. No congestion. No earaches. No fevers or chills. Denies history of allergies. She does wear contacts. ? ?Past Medical History:  ?Diagnosis Date  ? Atrial fibrillation ([place])   ? BMI 28.0-28.9,adult   ? Chest pain on exertion 2015  ? Heart murmur   ? Hyperlipidemia   ? Hypertension   ? Hypothyroidism   ? 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 Impaired fasting glucose   ? Osteoporosis   ? Palpitations   ? Pleuritic pain   ? right lung base  ? PONV (postoperative nausea and vomiting)   ? SOB (shortness of breath) on exertion 2015  ? TMJ (dislocation of temporomandibular joint)   ? "not as bad as it was; very minor now" (02/11/2016)  ? Type II diabetes mellitus ([place])   ? "they say I'm prediabetic but I'm on RX" (02/11/2016)  ? ? ?Past Surgical History:  ?Procedure Laterality Date  ? ABDOMINAL HYSTERECTOMY  2000s  ? not due to cancer; partial  ? BACK SURGERY    ? CERVICAL DISCECTOMY  2000s  ? [place]; 1998  ? COLONOSCOPY    ? ELECTROPHYSIOLOGIC STUDY N/A 02/11/2016  ? Procedure: A-Fluttter Ablation;  Surgeon: [name], MD;  Location: [facility];  Service: Cardiovascular;  Laterality: N/A;  ? LEFT HEART CATHETERIZATION WITH CORONARY ANGIOGRAM N/A 06/12/2014  ? Procedure: LEFT HEART CATHETERIZATION WITH CORONARY ANGIOGRAM;  Surgeon: [name], MD;  Location: [facility] CATH LAB;  Service: Cardiovascular;  Laterality: N/A;  ? THYROIDECTOMY, PARTIAL Left   ? "pre-cancerous cells"  ? ? ?Family History  ?Problem Relation Age of Onset  ? Diabetes Mellitus I Mother   ? Hypertension Father   ? Hyperlipidemia Father   ? Colon polyps Father   ? Heart attack Paternal Grandfather 64  ? Stroke  Paternal Aunt 63  ? Heart attack Paternal Uncle   ? Heart attack Cousin 32  ? Colon cancer Neg Hx   ? Rectal cancer Neg Hx   ? Esophageal cancer Neg Hx   ? Stomach cancer Neg Hx   ? ? ?Social History  ? ?Socioeconomic History  ? Marital status: Married  ?  Spouse name: Not on file  ? Number of children: Not on file  ? Years of education: Not on file  ? Highest education level: Not on file  ?Occupational History  ? Not on file  ?Tobacco Use  ? Smoking status: Never  ? Smokeless tobacco: Never  ?Vaping Use  ? Vaping Use: Never used  ?Substance and Sexual Activity  ? Alcohol use: No  ?  Alcohol/week: 0.0 standard drinks  ? Drug use: No  ? Sexual activity: Yes  ?  Birth control/protection: None, Surgical  ?Other Topics Concern  ? Not on file  ?Social History Narrative  ? Not on file  ? ?Social Determinants of Health  ? ?Financial Resource Strain: Not on file  ?Food Insecurity: Not on file  ?Transportation Needs: Not on file  ?Physical Activity: Not on file  ?Stress: Not on file  ?Social Connections: Not on file  ?Intimate Partner Violence: Not  on file  ? ? ?Outpatient Medications Prior to Visit  ?Medication Sig Dispense Refill  ? calcium carbonate (OS-CAL - DOSED IN MG OF ELEMENTAL CALCIUM) 1250 (500 Ca) MG tablet Take 2 tablets by mouth daily with breakfast.    ? ezetimibe (ZETIA) 10 MG tablet Take 10 mg by mouth daily.    ? metFORMIN (GLUCOPHAGE-XR) 500 MG 24 hr tablet Take 500 mg by mouth daily.    ? simvastatin (ZOCOR) 20 MG tablet Take 20 mg by mouth daily.    ? thyroid (ARMOUR) 90 MG tablet Take 90 mg by mouth daily.    ? Vitamin D, Ergocalciferol, (DRISDOL) 50000 units CAPS capsule Take 50,000 Units by mouth once a week. Thursday  5  ? metoprolol succinate (TOPROL-XL) 50 MG 24 hr tablet TAKE 1 TABLET BY MOUTH EVERY DAY (TAKE IMMEDIATELY FOLLOWING A MEAL). Please make yearly appt with Dr. Lovena Le for May 2023 for future refills. Thank you 1st attempt 90 tablet 0  ? nitrofurantoin, macrocrystal-monohydrate,  (MACROBID) 100 MG capsule Take 1 capsule (100 mg total) by mouth 2 (two) times daily. 14 capsule 0  ? ?Facility-Administered Medications Prior to Visit  ?Medication Dose Route Frequency Provider Last Rate Last Admin  ? 0.9 %  sodium chloride infusion  500 mL Intravenous Continuous Nandigam, Kavitha V, MD      ? 0.9 %  sodium chloride infusion  500 mL Intravenous Continuous Nandigam, Kavitha V, MD      ? 0.9 %  sodium chloride infusion  500 mL Intravenous Once Nandigam, Venia Minks, MD      ? ? ?Allergies  ?Allergen Reactions  ? Adhesive [Tape] Hives and Itching  ? Almond Oil Itching and Swelling  ? Latex Hives  ? ? ?Review of Systems  ?Constitutional:  Negative for chills, fatigue and fever.  ?HENT:  Negative for congestion, ear pain and sore throat.   ?Eyes:  Positive for discharge, redness and itching.  ? ?   ?Objective:  ?  ?Physical Exam ?Vitals reviewed.  ?Constitutional:   ?   Appearance: Normal appearance.  ?HENT:  ?   Right Ear: Tympanic membrane, ear canal and external ear normal.  ?   Left Ear: Tympanic membrane, ear canal and external ear normal.  ?   Nose: Nose normal.  ?   Mouth/Throat:  ?   Pharynx: Oropharynx is clear.  ?Eyes:  ?   General:     ?   Right eye: Discharge present.     ?   Left eye: Discharge present. ?   Conjunctiva/sclera:  ?   Right eye: Right conjunctiva is injected. Chemosis present.  ?   Left eye: Left conjunctiva is injected. Chemosis present.  ?   Comments: Eyes swollen BL. Left > right.   ?Cardiovascular:  ?   Rate and Rhythm: Normal rate and regular rhythm.  ?   Heart sounds: Normal heart sounds. No murmur heard. ?Pulmonary:  ?   Effort: Pulmonary effort is normal. No respiratory distress.  ?   Breath sounds: Normal breath sounds.  ?Lymphadenopathy:  ?   Cervical: No cervical adenopathy.  ?Neurological:  ?   Mental Status: She is alert and oriented to person, place, and time.  ?Psychiatric:     ?   Mood and Affect: Mood normal.     ?   Behavior: Behavior normal.  ? ? ?BP 140/78    Pulse 60   Temp (!) 96.9 ?F (36.1 ?C)   Resp 16   Ht 5'  4" (1.626 m)   Wt 169 lb 9.6 oz (76.9 kg)   BMI 29.11 kg/m?  ?Wt Readings from Last 3 Encounters:  ?09/11/21 169 lb 9.6 oz (76.9 kg)  ?09/02/21 171 lb 12.8 oz (77.9 kg)  ?10/27/20 172 lb 6.4 oz (78.2 kg)  ? ? ?Health Maintenance Due  ?Topic Date Due  ? COVID-19 Vaccine (1) Never done  ? HIV Screening  Never done  ? Hepatitis C Screening  Never done  ? TETANUS/TDAP  Never done  ? Zoster Vaccines- Shingrix (1 of 2) Never done  ? ? ?There are no preventive care reminders to display for this patient. ? ? ?Lab Results  ?Component Value Date  ? TSH 5.20 (H) 01/13/2016  ? ?Lab Results  ?Component Value Date  ? WBC 5.9 01/30/2016  ? HGB 14.6 01/30/2016  ? HCT 42.9 01/30/2016  ? MCV 88.3 01/30/2016  ? PLT 242 01/30/2016  ? ?Lab Results  ?Component Value Date  ? NA 139 01/30/2016  ? K 4.1 01/30/2016  ? CO2 24 01/30/2016  ? GLUCOSE 106 (H) 01/30/2016  ? BUN 16 01/30/2016  ? CREATININE 0.81 01/30/2016  ? CALCIUM 9.6 01/30/2016  ? ANIONGAP 13 01/11/2016  ? ?No results found for: CHOL ?No results found for: HDL ?No results found for: [place] ?No results found for: TRIG ?No results found for: CHOLHDL ?No results found for: HGBA1C ? ?   ?Assessment & Plan:  ? ?Problem List Items Addressed This Visit   ? ?  ? Other  ? Acute bacterial conjunctivitis of both eyes - Primary  ?  Cipro eye drops.  ?  ?  ? ?Meds ordered this encounter  ?Medications  ? ciprofloxacin (CILOXAN) 0.3 % ophthalmic solution  ?  Sig: Place 1 drop into both eyes every 4 (four) hours while awake. Administer 1 drop, every 2 hours, while awake, for 2 days. Then 1 drop, every 4 hours, while awake, for the next 5 days.  ?  Dispense:  5 mL  ?  Refill:  0  ? ? ?No orders of the defined types were placed in this encounter. ?  ? ?Follow-up: Return if symptoms worsen or fail to improve. ? ?An After Visit Summary was printed and given to the patient. ? ?Rochel Brome, MD ?[place] ?[phone] ?

## 2021-09-21 ENCOUNTER — Other Ambulatory Visit: Payer: Self-pay | Admitting: Internal Medicine

## 2021-09-24 ENCOUNTER — Encounter: Payer: Self-pay | Admitting: Family Medicine

## 2021-09-24 NOTE — Assessment & Plan Note (Signed)
Cipro eye drops.  ?

## 2021-11-08 ENCOUNTER — Other Ambulatory Visit: Payer: Self-pay | Admitting: Internal Medicine

## 2021-11-20 ENCOUNTER — Other Ambulatory Visit: Payer: Self-pay | Admitting: Internal Medicine

## 2021-12-07 ENCOUNTER — Ambulatory Visit: Payer: BC Managed Care – PPO | Admitting: Physician Assistant

## 2021-12-08 ENCOUNTER — Other Ambulatory Visit: Payer: Self-pay | Admitting: Internal Medicine

## 2021-12-10 ENCOUNTER — Ambulatory Visit: Payer: BC Managed Care – PPO | Admitting: Physician Assistant

## 2021-12-10 ENCOUNTER — Encounter: Payer: Self-pay | Admitting: Physician Assistant

## 2021-12-10 VITALS — BP 138/82 | HR 66 | Ht 64.0 in | Wt 168.4 lb

## 2021-12-10 DIAGNOSIS — I483 Typical atrial flutter: Secondary | ICD-10-CM | POA: Diagnosis not present

## 2021-12-10 DIAGNOSIS — I10 Essential (primary) hypertension: Secondary | ICD-10-CM

## 2021-12-10 DIAGNOSIS — R002 Palpitations: Secondary | ICD-10-CM | POA: Diagnosis not present

## 2021-12-10 NOTE — Progress Notes (Signed)
Cardiology Office Note Date:  12/10/2021  Patient ID:  Claudia Shelton, Claudia Shelton 1960-09-06, MRN [id number] PCP:  Rochel Brome, MD  Cardiologist:  Dr. Lovena Le    Chief Complaint:   annual visit  History of Present Illness: Claudia Shelton is a 61 y.o. female with history of AFlutter ablated, HTN, Hypothyroidism, DM.    She come sin today to be seen for Dr. Lovena Le, last seen by him in Sept 2017.  At that time she was post ablation and doing well, her Eliquis stopped and low dose ASA started.  Chart notes report planned going forward for every other year/PRN follow ups if doing well.   I saw her in Jan 2019, generally reported she was feeling well, no CP, no dizziness, near syncope or syncope.  She mentioned palpitations, generally at least 2-3 x day, most days, a brief feeling of a quiver feeling, this was somewhat reminiscent of her AFlutter though shorter, often lasting only a few seconds.  The day prior to her appt she forgot to take her metoprolol as usual in the morning, and that night felt like her heart beat was unusual, not irregular, but somehow different and made her feel a little SOB.  No SOB otherwise.  She reported being active daily though no dedicated exercise, she denied any change to her exertional capacity, no difficulty with her ADLs. She was ordered for a 48 monitor given her daily symptoms, this noted SR with rare PVCs only.  I saw her again march 2019, she was doing well, palpitations were less and not bothersome.  Since her last visit with [place] her thyroid replacement had been increased, she had f/u on that next month.  No CP or SOB, no dizziness, near syncope or syncope.  Not exercising but reports always on the move and active, no exertional intolerances.  Planned for annual/PRN visits  I saw her 09/17/19 She is doing well.  Over the last year less active then usual and put on a few pounds.  She mentions some DOE and chest discomfort with maximal exertion.  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  The 1st part is uphill and gets winded with the same chest heaviness.   These are unchanged dating back to her LHC in 2015.  No change in behavior, not escalating in any way. No symptoms with the last 1./2 of her walk that is down hill, none with daily activites or moderate exertion. No dizziness, near syncope or syncope.  She has infrequent very fleeting quick beats/palpitations. At a couple MD visits her BP has been about 150/90.  She sees her endocrinologist 2x a year and has labs including lipids done. She was asked to monitor her BP at home and notify if persistently elevated Encouraged exercise, weight loss CP was chronic and unchanged back to the time of her cath, no changes made.  I saw her 10/27/20 She is doing well over all Unfortunately had COVID in March, had a terrible cough, never really felt SOB, did lose taste/smell, these have yet to come back completely She had at the end of her COVID illness a couple times, maybe 3 of a feeling of something squeezing her heart, just for a second or two, no associated symptoms, and not again. She has infrequent and brief palpitations, did have one episode this AM last a few seconds, very fast and makes her take  in a deep breath.  These are rare. She is not exercising, but active at work in [employer], managing the books, often carrying a box of books, feels like she has good exertional capacity, no CP or unusual SOB. NO dizzy spells, near syncope or syncope. She follows regularly with her PMD and endocrinologist. Last HBG A1c was about 5.8 and lipids were reported as much improved. Planned for an echo She was asked to let us know of the behavior of her palpitations change in any way. As well as monitor her BP at home, letting us know if routinely elevated   Echo looked ok, LVEF 60-65%, no WMA, , grade I DD, Possible interatrial  shunting seen on clip 61. Not seen on other views  TODAY She is doing well Unfortunately she had a gap in her metoprolol waiting for refills for a few days, when she was off it had palpitations that gave her some SOB, but once back on feeling well Infrequently she will feel a fleeting grab/squeeze in her chest, comes/goes in a second. Not positional or exertional. No near syncope or syncope. No formal exercise but busy without intolerances.  Reports her endocrinologist does labs regularly including lipids.   Past Medical History:  Diagnosis Date   Atrial fibrillation ([place])    BMI 28.0-28.9,adult    Chest pain on exertion 2015   Heart murmur    Hyperlipidemia    Hypertension    Hypothyroidism    half of thyroid removed d/t large goiter   Impaired fasting glucose    Osteoporosis    Palpitations    Pleuritic pain    right lung base   PONV (postoperative nausea and vomiting)    SOB (shortness of breath) on exertion 2015   TMJ (dislocation of temporomandibular joint)    "not as bad as it was; very minor now" (02/11/2016)   Type II diabetes mellitus ([place])    "they say I'm prediabetic but I'm on RX" (02/11/2016)    Past Surgical History:  Procedure Laterality Date   ABDOMINAL HYSTERECTOMY  2000s   not due to cancer; partial   BACK SURGERY     CERVICAL DISCECTOMY  2000s   CESAREAN SECTION  1993; 1998   COLONOSCOPY     ELECTROPHYSIOLOGIC STUDY N/A 02/11/2016   Procedure: A-Fluttter Ablation;  Surgeon: [name], MD;  Location: [facility];  Service: Cardiovascular;  Laterality: N/A;   LEFT HEART CATHETERIZATION WITH CORONARY ANGIOGRAM N/A 06/12/2014   Procedure: LEFT HEART CATHETERIZATION WITH CORONARY ANGIOGRAM;  Surgeon: [name], MD;  Location: [facility];  Service: Cardiovascular;  Laterality: N/A;   THYROIDECTOMY, PARTIAL Left    "pre-cancerous cells"    Current Outpatient Medications  Medication Sig Dispense Refill   calcium carbonate (OS-CAL -  DOSED IN MG OF ELEMENTAL CALCIUM) 1250 (500 Ca) MG tablet Take 2 tablets by mouth daily with breakfast.     ezetimibe (ZETIA) 10 MG tablet Take 10 mg by mouth daily.     metFORMIN (GLUCOPHAGE-XR) 500 MG 24 hr tablet Take 500 mg by mouth 2 (two) times daily.     metoprolol succinate (TOPROL-XL) 50 MG 24 hr tablet TAKE 1 TABLET BY MOUTH EVERY DAY IMMEDIATELY FOLLOWING A MEAL. 15 tablet 0   simvastatin (ZOCOR) 20 MG tablet Take 20 mg by mouth daily.     thyroid (ARMOUR) 90 MG tablet Take 90 mg by mouth daily.     Vitamin D, Ergocalciferol, (DRISDOL) 50000 units CAPS capsule  Take 50,000 Units by mouth once a week. Thursday  5   Current Facility-Administered Medications  Medication Dose Route Frequency Provider Last Rate Last Admin   0.9 %  sodium chloride infusion  500 mL Intravenous Continuous Nandigam, Kavitha V, MD       0.9 %  sodium chloride infusion  500 mL Intravenous Continuous Nandigam, Kavitha V, MD       0.9 %  sodium chloride infusion  500 mL Intravenous Once Nandigam, Venia Minks, MD        Allergies:   Adhesive [tape], Almond oil, and Latex   Social History:  The patient  reports that she has never smoked. She has never used smokeless tobacco. She reports that she does not drink alcohol and does not use drugs.   Family History:  The patient's family history includes Colon polyps in her father; Diabetes Mellitus I in her mother; Heart attack in her paternal uncle; Heart attack (age of onset: 70) in her cousin; Heart attack (age of onset: 61) in her paternal grandfather; Hyperlipidemia in her father; Hypertension in her father; Stroke (age of onset: 53) in her paternal aunt.  ROS:  Please see the history of present illness.   All other systems are reviewed and otherwise negative.   PHYSICAL EXAM:  VS:  BP 138/82   Pulse 66   Ht '5\' 4"'$  (1.626 m)   Wt 168 lb 6.4 oz (76.4 kg)   SpO2 97%   BMI 28.91 kg/m  BMI: Body mass index is 28.91 kg/m. Well nourished, well developed, in no  acute distress  HEENT: normocephalic, atraumatic  Neck: no JVD, carotid bruits or masses Cardiac: RRR; no murmurs, no rubs, or gallops Lungs:  CTA b/l, no wheezing, rhonchi or rales  Abd: soft, nontender MS: no deformity or atrophy Ext:  no edema  Skin: warm and dry, no rash Neuro:  No gross deficits appreciated Psych: euthymic mood, full affect    EKG:  Done today and reviewed by myself;  SR 67bpm, no changes, normal  11/21/20; TTE  1. Left ventricular ejection fraction, by estimation, is 60 to 65%. The  left ventricle has normal function. The left ventricle has no regional  wall motion abnormalities. There is mild concentric left ventricular  hypertrophy. Left ventricular diastolic  parameters are consistent with Grade I diastolic dysfunction (impaired  relaxation).   2. Right ventricular systolic function is normal. The right ventricular  size is normal.   3. Left atrial size was mildly dilated.   4. The mitral valve is normal in structure. Trivial mitral valve  regurgitation. No evidence of mitral stenosis.   5. The aortic valve is tricuspid. Aortic valve regurgitation is trivial.  No aortic stenosis is present.   6. The inferior vena cava is normal in size with greater than 50%  respiratory variability, suggesting right atrial pressure of 3 mmHg.   7. Possible interatrial shunting seen on clip 61. Not seen on other  views. Likely iatrogenic from prior Afib ablation.   Comparison(s): No significant change from prior study.   07/25/17: 48 Hr holter 1. NSR with sinus bradycardia and sinus tachycardia 2. Rare PVC's 3. No sustained pauses, SVT or VT.  02/10/17: EPS/Ablation CONCLUSIONS:  1. Inducible, typical, isthmus dependent atrial flutter.  2. Spontaneous atrial fibrillation  3. Successful radiofrequency ablation of atrial flutter along the cavotricuspid isthmus with complete bidirectional isthmus block achieved.  4. No inducible arrhythmias following ablation.  5.  No early apparent complications.   01/22/16:  TTE Study Conclusions - Left ventricle: The cavity size was normal. Wall thickness was   normal. Systolic function was normal. The estimated ejection   fraction was in the range of 55% to 60%. Wall motion was normal;   there were no regional wall motion abnormalities. Left   ventricular diastolic function parameters were normal. - Aortic valve: There was trivial regurgitation.  06/12/2014: LHC IMPRESSIONS:  1. Normal coronary arteries 2. Hyperdynamic left ventricular systolic function. Chronic diastolic heart failure with elevated end-diastolic pressures. 3. Essential hypertension    Recent Labs: No results found for requested labs within last 365 days.  No results found for requested labs within last 365 days.   CrCl cannot be calculated (Patient's most recent lab result is older than the maximum 21 days allowed.).   Wt Readings from Last 3 Encounters:  12/10/21 168 lb 6.4 oz (76.4 kg)  09/11/21 169 lb 9.6 oz (76.9 kg)  09/02/21 171 lb 12.8 oz (77.9 kg)     Other studies reviewed: Additional studies/records reviewed today include: summarized above  ASSESSMENT AND PLAN:  1. AFlutter ablated Aug 2017     CHA2DS2Vasc is 3, off a/c post ablation     Doing well      2. HTN     Looks ok, just got back in her metoprolol     Disposition: we can continue to see her annually, sooner if needed    Current medicines are reviewed at length with the patient today.  The patient did not have any concerns regarding medicines.  Venetia Night, PA-C 12/10/2021 4:08 PM     [address] [place] [address] [phone] (office)  [phone] (fax)

## 2021-12-10 NOTE — Patient Instructions (Signed)
Medication Instructions:  Your physician recommends that you continue on your current medications as directed. Please refer to the Current Medication list given to you today.  *If you need a refill on your cardiac medications before your next appointment, please call your pharmacy*   Lab Work: None  If you have labs (blood work) drawn today and your tests are completely normal, you will receive your results only by: MyChart Message (if you have MyChart) OR A paper copy in the mail If you have any lab test that is abnormal or we need to change your treatment, we will call you to review the results.   Follow-Up: At [facility], you and your health needs are our priority.  As part of our continuing mission to provide you with exceptional heart care, we have created designated Provider Care Teams.  These Care Teams include your primary Cardiologist (physician) and Advanced Practice Providers (APPs -  Physician Assistants and Nurse Practitioners) who all work together to provide you with the care you need, when you need it.  We recommend signing up for the patient portal called "MyChart".  Sign up information is provided on this After Visit Summary.  MyChart is used to connect with patients for Virtual Visits (Telemedicine).  Patients are able to view lab/test results, encounter notes, upcoming appointments, etc.  Non-urgent messages can be sent to your provider as well.   To learn more about what you can do with MyChart, go to [email or web address].    Your next appointment:   1 year(s)  The format for your next appointment:   In Person  Provider:   Gregg Taylor, MD{ 

## 2021-12-16 ENCOUNTER — Other Ambulatory Visit: Payer: Self-pay | Admitting: Internal Medicine

## 2022-05-21 ENCOUNTER — Encounter: Payer: Self-pay | Admitting: Physician Assistant

## 2022-05-21 ENCOUNTER — Ambulatory Visit: Payer: BC Managed Care – PPO | Admitting: Physician Assistant

## 2022-05-21 VITALS — Ht 64.0 in | Wt 165.0 lb

## 2022-05-21 DIAGNOSIS — U071 COVID-19: Secondary | ICD-10-CM

## 2022-05-21 LAB — POC COVID19 BINAXNOW: SARS Coronavirus 2 Ag: POSITIVE — AB

## 2022-05-21 MED ORDER — BENZONATATE 100 MG PO CAPS: 100.0000 mg | ORAL_CAPSULE | Freq: Two times a day (BID) | ORAL | 0 refills | Status: DC | PRN

## 2022-05-21 MED ORDER — MOLNUPIRAVIR EUA 200MG CAPSULE: 4.0000 | ORAL_CAPSULE | Freq: Two times a day (BID) | ORAL | 0 refills | Status: AC

## 2022-05-21 NOTE — Progress Notes (Signed)
Virtual Visit via Telephone Note   This visit type was conducted due to national recommendations for restrictions regarding the COVID-19 Pandemic (e.g. social distancing) in an effort to limit this patient's exposure and mitigate transmission in our community.  Due to her co-morbid illnesses, this patient is at least at moderate risk for complications without adequate follow up.  This format is felt to be most appropriate for this patient at this time.  The patient did not have access to video technology/had technical difficulties with video requiring transitioning to audio format only (telephone).  All issues noted in this document were discussed and addressed.  No physical exam could be performed with this format.  Patient verbally consented to a telehealth visit.   Date:  05/21/2022   ID:  Claudia Shelton, DOB [birth year], MRN [id number]  Patient Location: Home Provider Location: Office  PCP:  Rochel Brome, MD    Chief Complaint:  cough/malaise/fever  History of Present Illness:    Claudia Shelton is a 61 y.o. female with complaints that yesterday woke up with lots of congestion and then last night started running fever up 10 103.  She is now coughing and has a headache.  She denies chest pain or dyspnea  The patient does have symptoms concerning for COVID-19 infection (fever, chills, cough, or new shortness of breath).    Past Medical History:  Diagnosis Date   Atrial fibrillation ([place])    BMI 28.0-28.9,adult    Chest pain on exertion 2015   Heart murmur    Hyperlipidemia    Hypertension    Hypothyroidism    half of thyroid removed d/t large goiter   Impaired fasting glucose    Osteoporosis    Palpitations    Pleuritic pain    right lung base   PONV (postoperative nausea and vomiting)    SOB (shortness of breath) on exertion 2015   TMJ (dislocation of temporomandibular joint)    "not as bad as it was; very minor now" (02/11/2016)   Type II diabetes mellitus ([place])    "they say  I'm prediabetic but I'm on RX" (02/11/2016)   Past Surgical History:  Procedure Laterality Date   ABDOMINAL HYSTERECTOMY  2000s   not due to cancer; partial   BACK SURGERY     CERVICAL DISCECTOMY  2000s   CESAREAN SECTION  1993; 1998   COLONOSCOPY     ELECTROPHYSIOLOGIC STUDY N/A 02/11/2016   Procedure: A-Fluttter Ablation;  Surgeon: [name], MD;  Location: [place] [facility];  Service: Cardiovascular;  Laterality: N/A;   LEFT HEART CATHETERIZATION WITH CORONARY ANGIOGRAM N/A 06/12/2014   Procedure: LEFT HEART CATHETERIZATION WITH CORONARY ANGIOGRAM;  Surgeon: [name], MD;  Location: [facility] CATH LAB;  Service: Cardiovascular;  Laterality: N/A;   THYROIDECTOMY, PARTIAL Left    "pre-cancerous cells"     Current Meds  Medication Sig   benzonatate (TESSALON) 100 MG capsule Take 1 capsule (100 mg total) by mouth 2 (two) times daily as needed for cough.   calcium carbonate (OS-CAL - DOSED IN MG OF ELEMENTAL CALCIUM) 1250 (500 Ca) MG tablet Take 2 tablets by mouth daily with breakfast.   ezetimibe (ZETIA) 10 MG tablet Take 10 mg by mouth daily.   metFORMIN (GLUCOPHAGE-XR) 500 MG 24 hr tablet Take 500 mg by mouth 2 (two) times daily.   metoprolol succinate (TOPROL-XL) 50 MG 24 hr tablet TAKE 1 TABLET BY MOUTH EVERY DAY IMMEDIATELY FOLLOWING A MEAL.   molnupiravir EUA (  LAGEVRIO) 200 mg CAPS capsule Take 4 capsules (800 mg total) by mouth 2 (two) times daily for 5 days.   simvastatin (ZOCOR) 20 MG tablet Take 20 mg by mouth daily.   thyroid (ARMOUR) 90 MG tablet Take 90 mg by mouth daily.   Vitamin D, Ergocalciferol, (DRISDOL) 50000 units CAPS capsule Take 50,000 Units by mouth once a week. Thursday   Current Facility-Administered Medications for the 05/21/22 encounter (Office Visit) with Marge Duncans, PA-C  Medication   0.9 %  sodium chloride infusion   0.9 %  sodium chloride infusion   0.9 %  sodium chloride infusion     Allergies:   Adhesive [tape], Almond oil, and Latex    Social History   Tobacco Use   Smoking status: Never   Smokeless tobacco: Never  Vaping Use   Vaping Use: Never used  Substance Use Topics   Alcohol use: No    Alcohol/week: 0.0 standard drinks of alcohol   Drug use: No     Family Hx: The patient's family history includes Colon polyps in her father; Diabetes Mellitus I in her mother; Heart attack in her paternal uncle; Heart attack (age of onset: 69) in her cousin; Heart attack (age of onset: 37) in her paternal grandfather; Hyperlipidemia in her father; Hypertension in her father; Stroke (age of onset: 73) in her paternal aunt. There is no history of Colon cancer, Rectal cancer, Esophageal cancer, or Stomach cancer.  ROS:   Please see the history of present illness.    All other systems reviewed and are negative.  Labs/Other Tests and Data Reviewed:    Recent Labs: No results found for requested labs within last 365 days.   Recent Lipid Panel No results found for: "CHOL", "TRIG", "HDL", "CHOLHDL", "LDLCALC", "LDLDIRECT"  Wt Readings from Last 3 Encounters:  05/21/22 165 lb (74.8 kg)  12/10/21 168 lb 6.4 oz (76.4 kg)  09/11/21 169 lb 9.6 oz (76.9 kg)     Objective:    Vital Signs:  Ht '5\' 4"'$  (1.626 m)   Wt 165 lb (74.8 kg)   BMI 28.32 kg/m    VITAL SIGNS:  reviewed GEN:  no acute distress  Office Visit on 05/21/2022  Component Date Value Ref Range Status   SARS Coronavirus 2 Ag 05/21/2022 Positive (A)  Negative Final     ASSESSMENT & PLAN:    COVID 19 --- rx for molnupiravir and tessalone perles (do not take cholesterol medication while taking) - recommend rest , fluids and tylenol Quarantine for 5 days then mask for 5 days  COVID-19 Education: The signs and symptoms of COVID-19 were discussed with the patient and how to seek care for testing (follow up with PCP or arrange E-visit). The importance of social distancing was discussed today.  Time:   Today, I have spent 10 minutes with the patient with  telehealth technology discussing the above problems.     Medication Adjustments/Labs and Tests Ordered: Current medicines are reviewed at length with the patient today.  Concerns regarding medicines are outlined above.   Tests Ordered: Orders Placed This Encounter  Procedures   POC COVID-19 BinaxNow    Medication Changes: Meds ordered this encounter  Medications   molnupiravir EUA (LAGEVRIO) 200 mg CAPS capsule    Sig: Take 4 capsules (800 mg total) by mouth 2 (two) times daily for 5 days.    Dispense:  40 capsule    Refill:  0    Order Specific Question:   Supervising Provider  Answer:   COX, Lynder Parents   benzonatate (TESSALON) 100 MG capsule    Sig: Take 1 capsule (100 mg total) by mouth 2 (two) times daily as needed for cough.    Dispense:  20 capsule    Refill:  0    Order Specific Question:   Supervising Provider    AnswerShelton Silvas    Follow Up:  In Person prn  Signed, Webb Silversmith, PA-C  05/21/2022 11:22 AM    [place]

## 2022-05-21 NOTE — Progress Notes (Deleted)
Acute Office Visit  Subjective:    Patient ID: Claudia Shelton, female    DOB: 03-16-1961, 61 y.o.   MRN: [id number]  Chief Complaint  Patient presents with   Fever   Nasal Congestion    HPI: Patient is in today for positive covid test..  Nasal congestion and fever plus cough.  Patient states that her symptoms started yesterday morning.  Cough has just started and high fever.  Past Medical History:  Diagnosis Date   Atrial fibrillation ([place])    BMI 28.0-28.9,adult    Chest pain on exertion 2015   Heart murmur    Hyperlipidemia    Hypertension    Hypothyroidism    half of thyroid removed d/t large goiter   Impaired fasting glucose    Osteoporosis    Palpitations    Pleuritic pain    right lung base   PONV (postoperative nausea and vomiting)    SOB (shortness of breath) on exertion 2015   TMJ (dislocation of temporomandibular joint)    "not as bad as it was; very minor now" (02/11/2016)   Type II diabetes mellitus ([place])    "they say I'm prediabetic but I'm on RX" (02/11/2016)    Past Surgical History:  Procedure Laterality Date   ABDOMINAL HYSTERECTOMY  2000s   not due to cancer; partial   BACK SURGERY     CERVICAL DISCECTOMY  2000s   CESAREAN SECTION  1993; 1998   COLONOSCOPY     ELECTROPHYSIOLOGIC STUDY N/A 02/11/2016   Procedure: A-Fluttter Ablation;  Surgeon: [name], MD;  Location: [place] [facility];  Service: Cardiovascular;  Laterality: N/A;   LEFT HEART CATHETERIZATION WITH CORONARY ANGIOGRAM N/A 06/12/2014   Procedure: LEFT HEART CATHETERIZATION WITH CORONARY ANGIOGRAM;  Surgeon: [name], MD;  Location: [facility] CATH LAB;  Service: Cardiovascular;  Laterality: N/A;   THYROIDECTOMY, PARTIAL Left    "pre-cancerous cells"    Family History  Problem Relation Age of Onset   Diabetes Mellitus I Mother    Hypertension Father    Hyperlipidemia Father    Colon polyps Father    Heart attack Paternal Grandfather 101   Stroke Paternal Aunt 49   Heart  attack Paternal Uncle    Heart attack Cousin 45   Colon cancer Neg Hx    Rectal cancer Neg Hx    Esophageal cancer Neg Hx    Stomach cancer Neg Hx     Social History   Socioeconomic History   Marital status: Married    Spouse name: Not on file   Number of children: Not on file   Years of education: Not on file   Highest education level: Not on file  Occupational History   Not on file  Tobacco Use   Smoking status: Never   Smokeless tobacco: Never  Vaping Use   Vaping Use: Never used  Substance and Sexual Activity   Alcohol use: No    Alcohol/week: 0.0 standard drinks of alcohol   Drug use: No   Sexual activity: Yes    Birth control/protection: None, Surgical  Other Topics Concern   Not on file  Social History Narrative   Not on file   Social Determinants of Health   Financial Resource Strain: Not on file  Food Insecurity: Not on file  Transportation Needs: Not on file  Physical Activity: Not on file  Stress: Not on file  Social Connections: Not on file  Intimate Partner Violence: Not on file  Outpatient Medications Prior to Visit  Medication Sig Dispense Refill   calcium carbonate (OS-CAL - DOSED IN MG OF ELEMENTAL CALCIUM) 1250 (500 Ca) MG tablet Take 2 tablets by mouth daily with breakfast.     ezetimibe (ZETIA) 10 MG tablet Take 10 mg by mouth daily.     metFORMIN (GLUCOPHAGE-XR) 500 MG 24 hr tablet Take 500 mg by mouth 2 (two) times daily.     metoprolol succinate (TOPROL-XL) 50 MG 24 hr tablet TAKE 1 TABLET BY MOUTH EVERY DAY IMMEDIATELY FOLLOWING A MEAL. 90 tablet 3   simvastatin (ZOCOR) 20 MG tablet Take 20 mg by mouth daily.     thyroid (ARMOUR) 90 MG tablet Take 90 mg by mouth daily.     Vitamin D, Ergocalciferol, (DRISDOL) 50000 units CAPS capsule Take 50,000 Units by mouth once a week. Thursday  5   Facility-Administered Medications Prior to Visit  Medication Dose Route Frequency Provider Last Rate Last Admin   0.9 %  sodium chloride infusion  500  mL Intravenous Continuous Nandigam, Kavitha V, MD       0.9 %  sodium chloride infusion  500 mL Intravenous Continuous Nandigam, Kavitha V, MD       0.9 %  sodium chloride infusion  500 mL Intravenous Once Nandigam, Kavitha V, MD        Allergies  Allergen Reactions   Adhesive [Tape] Hives and Itching   Almond Oil Itching and Swelling   Latex Hives    Review of Systems  Constitutional:  Positive for fever. Negative for chills and fatigue.  HENT:  Positive for congestion and sinus pain. Negative for ear pain and sore throat.   Respiratory:  Positive for cough. Negative for shortness of breath.   Cardiovascular:  Negative for chest pain and leg swelling.  Gastrointestinal:  Negative for abdominal pain, constipation, diarrhea, nausea and vomiting.  Genitourinary:  Negative for dysuria and frequency.  Musculoskeletal:  Negative for arthralgias, back pain and myalgias.  Neurological:  Positive for headaches. Negative for dizziness.       Objective:    Physical Exam  Ht '5\' 4"'$  (1.626 m)   Wt 165 lb (74.8 kg)   BMI 28.32 kg/m  Wt Readings from Last 3 Encounters:  05/21/22 165 lb (74.8 kg)  12/10/21 168 lb 6.4 oz (76.4 kg)  09/11/21 169 lb 9.6 oz (76.9 kg)    Health Maintenance Due  Topic Date Due   COVID-19 Vaccine (1) Never done   HIV Screening  Never done   Hepatitis C Screening  Never done   DTaP/Tdap/Td (1 - Tdap) Never done   Zoster Vaccines- Shingrix (1 of 2) Never done   INFLUENZA VACCINE  Never done    There are no preventive care reminders to display for this patient.   Lab Results  Component Value Date   TSH 5.20 (H) 01/13/2016   Lab Results  Component Value Date   WBC 5.9 01/30/2016   HGB 14.6 01/30/2016   HCT 42.9 01/30/2016   MCV 88.3 01/30/2016   PLT 242 01/30/2016   Lab Results  Component Value Date   NA 139 01/30/2016   K 4.1 01/30/2016   CO2 24 01/30/2016   GLUCOSE 106 (H) 01/30/2016   BUN 16 01/30/2016   CREATININE 0.81 01/30/2016    CALCIUM 9.6 01/30/2016   ANIONGAP 13 01/11/2016   No results found for: "CHOL" No results found for: "HDL" No results found for: "LDLCALC" No results found for: "TRIG" No results found  for: "CHOLHDL" No results found for: "HGBA1C"     Assessment & Plan:   Problem List Items Addressed This Visit   None  No orders of the defined types were placed in this encounter.   No orders of the defined types were placed in this encounter.    Follow-up: No follow-ups on file.  An After Visit Summary was printed and given to the patient.  Yetta Flock [facility] [phone]

## 2022-06-22 ENCOUNTER — Telehealth: Payer: Self-pay

## 2022-06-22 NOTE — Telephone Encounter (Signed)
Patient left voicemail stating a Endocrinologist ordered her DEXA which she had 05/2022, however she wants a second opinion and the test repeated but performed at [facility] in [place]. Request that you order this for her.

## 2022-06-23 NOTE — Telephone Encounter (Signed)
Left message for patient to call our office.

## 2022-06-24 ENCOUNTER — Telehealth: Payer: Self-pay

## 2022-06-24 DIAGNOSIS — M810 Age-related osteoporosis without current pathological fracture: Secondary | ICD-10-CM

## 2022-06-24 NOTE — Telephone Encounter (Signed)
Claudia Shelton called requesting that Dr. Tobie Poet order a DEXA scan at [place].  She has had one recently but they were having problems with the machine and she did not feel that she had an adequate test.  She realizes that her insurance will not pay fr the test to be repeated but she is willing to pay out of pocket.  Dr. Tobie Poet approved for the repeat test to be ordered.

## 2022-06-29 NOTE — Telephone Encounter (Signed)
Patient called this morning to follow-up on the order for a DEXA SCAN. Can someone please call this patient back today with more information.

## 2022-06-30 ENCOUNTER — Other Ambulatory Visit: Payer: Self-pay | Admitting: Family Medicine

## 2022-06-30 NOTE — Telephone Encounter (Signed)
Spoke to patient and made her aware that I faxed the order for DEXA so [facility] today, pt will call tomorrow to get scheduled

## 2022-07-16 LAB — HM DEXA SCAN

## 2022-07-16 LAB — HM MAMMOGRAPHY

## 2022-07-28 ENCOUNTER — Telehealth: Payer: Self-pay

## 2022-07-28 NOTE — Telephone Encounter (Signed)
Patient was notified of Dexa results.

## 2022-11-09 ENCOUNTER — Telehealth: Payer: Self-pay

## 2022-11-09 NOTE — Telephone Encounter (Signed)
Pt called today to request a same day appointment for the following symptoms:COUGHING/bronchitis. Unfortunately, our schedule is full and we have no openings between today or tomorrow. Pt was notified to go to Urgent Care.

## 2022-11-10 NOTE — Telephone Encounter (Signed)
The patient called the office this morning stating that she did not go to UC. She requested an appointment either today or tomorrow. Patient notified that we do not have an opening today but I was able to schedule her tomorrow with Langley Gauss, PA.

## 2022-11-11 ENCOUNTER — Ambulatory Visit: Payer: BC Managed Care – PPO | Admitting: Physician Assistant

## 2022-11-11 ENCOUNTER — Encounter: Payer: Self-pay | Admitting: Physician Assistant

## 2022-11-11 VITALS — BP 150/80 | HR 70 | Temp 98.4°F | Resp 14 | Ht 64.0 in | Wt 169.0 lb

## 2022-11-11 DIAGNOSIS — J069 Acute upper respiratory infection, unspecified: Secondary | ICD-10-CM | POA: Diagnosis not present

## 2022-11-11 MED ORDER — BENZONATATE 100 MG PO CAPS: 100.0000 mg | ORAL_CAPSULE | Freq: Two times a day (BID) | ORAL | 0 refills | Status: DC | PRN

## 2022-11-11 NOTE — Progress Notes (Signed)
Acute Office Visit  Subjective:    Patient ID: Claudia Shelton, female    DOB: 13-Sep-1960, 62 y.o.   MRN: [id number]  Chief Complaint  Patient presents with   Cough   chest congestion    HPI: Patient is in today for chest congestion, cough, pos nasal drainage and some SOB in expiration. She mentioned that 8 days ago, she started with cold symptoms and then she heard some crackles and rattles noises on her chest. She came to be checked for bronchitis or pneumonia.  She denies fever, chills, sore throat, ear pain. She tried mucinex DM, but she felt dizzy, so she stopped it. Patient denies fevers, sputum. Patient states she had walking pneumonia before but that was 10-15 years ago. Patient denies anyone else being sick at home. Patient denies sore throat, ear pain, sinus pressure, waking up coughing.   Past Medical History:  Diagnosis Date   Atrial fibrillation (HCC)    BMI 28.0-28.9,adult    Chest pain on exertion 2015   Heart murmur    Hyperlipidemia    Hypertension    Hypothyroidism    half of thyroid removed d/t large goiter   Impaired fasting glucose    Osteoporosis    Palpitations    Pleuritic pain    right lung base   PONV (postoperative nausea and vomiting)    SOB (shortness of breath) on exertion 2015   TMJ (dislocation of temporomandibular joint)    "not as bad as it was; very minor now" (02/11/2016)   Type II diabetes mellitus (HCC)    "they say I'm prediabetic but I'm on RX" (02/11/2016)    Past Surgical History:  Procedure Laterality Date   ABDOMINAL HYSTERECTOMY  11/27/2004   Laparoscopic supracervical hysterectomy, not due to cancer   BACK SURGERY     CERVICAL DISCECTOMY  2000s   CESAREAN SECTION  1993; 1998   COLONOSCOPY     ELECTROPHYSIOLOGIC STUDY N/A 02/11/2016   Procedure: A-Fluttter Ablation;  Surgeon: Marinus Maw, MD;  Location: [facility];  Service: Cardiovascular;  Laterality: N/A;   LEFT HEART CATHETERIZATION WITH CORONARY ANGIOGRAM N/A  06/12/2014   Procedure: LEFT HEART CATHETERIZATION WITH CORONARY ANGIOGRAM;  Surgeon: Lesleigh Noe, MD;  Location[facility] CATH LAB;  Service: Cardiovascular;  Laterality: N/A;   THYROIDECTOMY, PARTIAL Left    "pre-cancerous cells"    Family History  Problem Relation Age of Onset   Diabetes Mellitus I Mother    Hypertension Father    Hyperlipidemia Father    Colon polyps Father    Heart attack Paternal Grandfather 32   Stroke Paternal Aunt 50   Heart attack Paternal Uncle    Heart attack Cousin 62   Colon cancer Neg Hx    Rectal cancer Neg Hx    Esophageal cancer Neg Hx    Stomach cancer Neg Hx     Social History   Socioeconomic History   Marital status: Married    Spouse name: Not on file   Number of children: Not on file   Years of education: Not on file   Highest education level: Not on file  Occupational History   Not on file  Tobacco Use   Smoking status: Never   Smokeless tobacco: Never  Vaping Use   Vaping Use: Never used  Substance and Sexual Activity   Alcohol use: No    Alcohol/week: 0.0 standard drinks of alcohol   Drug use: No   Sexual activity: Yes  Birth control/protection: None, Surgical  Other Topics Concern   Not on file  Social History Narrative   Not on file   Social Determinants of Health   Financial Resource Strain: Not on file  Food Insecurity: Not on file  Transportation Needs: Not on file  Physical Activity: Not on file  Stress: Not on file  Social Connections: Not on file  Intimate Partner Violence: Not on file    Outpatient Medications Prior to Visit  Medication Sig Dispense Refill   alendronate (FOSAMAX) 70 MG tablet Take 70 mg by mouth once a week.     ARMOUR THYROID 120 MG tablet Take by mouth.     calcium carbonate (OS-CAL - DOSED IN MG OF ELEMENTAL CALCIUM) 1250 (500 Ca) MG tablet Take 2 tablets by mouth daily with breakfast.     ezetimibe (ZETIA) 10 MG tablet Take 10 mg by mouth daily.     metFORMIN (GLUCOPHAGE-XR) 500  MG 24 hr tablet Take 500 mg by mouth 2 (two) times daily.     metoprolol succinate (TOPROL-XL) 50 MG 24 hr tablet TAKE 1 TABLET BY MOUTH EVERY DAY IMMEDIATELY FOLLOWING A MEAL. 90 tablet 3   simvastatin (ZOCOR) 20 MG tablet Take 20 mg by mouth daily.     thyroid (ARMOUR) 90 MG tablet Take 90 mg by mouth daily.     Vitamin D, Ergocalciferol, (DRISDOL) 50000 units CAPS capsule Take 50,000 Units by mouth once a week. Thursday  5   benzonatate (TESSALON) 100 MG capsule Take 1 capsule (100 mg total) by mouth 2 (two) times daily as needed for cough. 20 capsule 0   0.9 %  sodium chloride infusion      0.9 %  sodium chloride infusion      0.9 %  sodium chloride infusion      No facility-administered medications prior to visit.    Allergies  Allergen Reactions   Adhesive [Tape] Hives and Itching   Almond Oil Itching and Swelling   Latex Hives    Review of Systems  Constitutional:  Negative for chills, fatigue and fever.  HENT:  Positive for postnasal drip. Negative for congestion, ear pain and sore throat.   Respiratory:  Positive for cough, chest tightness and shortness of breath.   Cardiovascular:  Negative for chest pain and palpitations.  Gastrointestinal:  Negative for abdominal pain, constipation, diarrhea, nausea and vomiting.  Endocrine: Negative for polydipsia, polyphagia and polyuria.  Genitourinary:  Negative for difficulty urinating and dysuria.  Musculoskeletal:  Negative for arthralgias, back pain and myalgias.  Skin:  Negative for rash.  Neurological:  Negative for dizziness and headaches.  Psychiatric/Behavioral:  Negative for dysphoric mood. The patient is not nervous/anxious.        Objective:        11/11/2022    8:46 AM 05/21/2022   11:01 AM 12/10/2021    3:35 PM  Vitals with BMI  Height 5\' 4"  5\' 4"  5\' 4"   Weight 169 lbs 165 lbs 168 lbs 6 oz  BMI 28.99 28.31 28.89  Systolic 150  138  Diastolic 80  82  Pulse 70  66    No data found.   Physical  Exam Constitutional:      Appearance: Normal appearance.  HENT:     Right Ear: Tympanic membrane normal.     Left Ear: Tympanic membrane normal.     Nose: Rhinorrhea present.     Mouth/Throat:     Pharynx: No oropharyngeal exudate or posterior oropharyngeal erythema.  Eyes:     Conjunctiva/sclera: Conjunctivae normal.  Neck:     Vascular: No carotid bruit.  Cardiovascular:     Rate and Rhythm: Normal rate and regular rhythm.     Heart sounds: Normal heart sounds.  Pulmonary:     Effort: Pulmonary effort is normal.     Breath sounds: Wheezing and rales present.  Abdominal:     General: Bowel sounds are normal.     Palpations: Abdomen is soft.     Tenderness: There is no abdominal tenderness.  Skin:    Findings: No lesion or rash.  Neurological:     Mental Status: She is alert and oriented to person, place, and time.  Psychiatric:        Behavior: Behavior normal.     Health Maintenance Due  Topic Date Due   COVID-19 Vaccine (1) Never done   HIV Screening  Never done   Hepatitis C Screening  Never done   DTaP/Tdap/Td (1 - Tdap) Never done   PAP SMEAR-Modifier  Never done   Zoster Vaccines- Shingrix (1 of 2) Never done   Colonoscopy  11/12/2022    There are no preventive care reminders to display for this patient.   Lab Results  Component Value Date   TSH 5.20 (H) 01/13/2016   Lab Results  Component Value Date   WBC 5.9 01/30/2016   HGB 14.6 01/30/2016   HCT 42.9 01/30/2016   MCV 88.3 01/30/2016   PLT 242 01/30/2016   Lab Results  Component Value Date   NA 139 01/30/2016   K 4.1 01/30/2016   CO2 24 01/30/2016   GLUCOSE 106 (H) 01/30/2016   BUN 16 01/30/2016   CREATININE 0.81 01/30/2016   CALCIUM 9.6 01/30/2016   ANIONGAP 13 01/11/2016   No results found for: "CHOL" No results found for: "HDL" No results found for: "LDLCALC" No results found for: "TRIG" No results found for: "CHOLHDL" No results found for: "HGBA1C"     Assessment & Plan:   Acute URI Assessment & Plan: Sent for chest x-ray to rule out pneumonia. Will prescribe antibiotics if any consolidation is found.   Orders: -     DG Chest 2 View; Future -     Benzonatate; Take 1 capsule (100 mg total) by mouth 2 (two) times daily as needed for cough.  Dispense: 20 capsule; Refill: 0     Meds ordered this encounter  Medications   benzonatate (TESSALON) 100 MG capsule    Sig: Take 1 capsule (100 mg total) by mouth 2 (two) times daily as needed for cough.    Dispense:  20 capsule    Refill:  0    Orders Placed This Encounter  Procedures   DG Chest 2 View     Follow-up: Return if symptoms worsen or fail to improve, for [facility].  An After Visit Summary was printed and given to the patient.  Langley Gauss, [facility] [phone]

## 2022-11-11 NOTE — Patient Instructions (Signed)
We will send the patient with an x-ray order to check for a possible consolidation in her lungs. Will prescribe antibiotics after results from x-ray come back if needed.

## 2022-11-11 NOTE — Assessment & Plan Note (Signed)
Sent for chest x-ray to rule out pneumonia. Will prescribe antibiotics if any consolidation is found.

## 2022-11-13 ENCOUNTER — Other Ambulatory Visit: Payer: Self-pay | Admitting: Internal Medicine

## 2022-11-15 ENCOUNTER — Encounter: Payer: Self-pay | Admitting: Physician Assistant

## 2022-12-07 ENCOUNTER — Other Ambulatory Visit: Payer: Self-pay | Admitting: Internal Medicine

## 2023-01-05 ENCOUNTER — Other Ambulatory Visit: Payer: Self-pay | Admitting: Internal Medicine

## 2023-02-01 ENCOUNTER — Ambulatory Visit: Payer: BC Managed Care – PPO | Attending: Internal Medicine | Admitting: Internal Medicine

## 2023-02-01 ENCOUNTER — Encounter: Payer: Self-pay | Admitting: Internal Medicine

## 2023-02-01 VITALS — BP 174/100 | HR 72 | Ht 64.0 in | Wt 171.2 lb

## 2023-02-01 DIAGNOSIS — R002 Palpitations: Secondary | ICD-10-CM

## 2023-02-01 MED ORDER — METOPROLOL SUCCINATE ER 50 MG PO TB24: ORAL_TABLET | ORAL | 1 refills | Status: DC

## 2023-02-01 NOTE — Progress Notes (Signed)
HPI Mrs. Govern returns today for followup.She is a pleasant 62 yo woman with a h/o atrial flutter who underwent catheter ablation several years ago. In the interim, she has been stable. She has had rare palpitations. No chest pain or edema.  Allergies  Allergen Reactions   Adhesive [Tape] Hives and Itching   Almond Oil Itching and Swelling   Latex Hives     Current Outpatient Medications  Medication Sig Dispense Refill   alendronate (FOSAMAX) 70 MG tablet Take 70 mg by mouth once a week.     ARMOUR THYROID 120 MG tablet Take by mouth.     benzonatate (TESSALON) 100 MG capsule Take 1 capsule (100 mg total) by mouth 2 (two) times daily as needed for cough. 20 capsule 0   calcium carbonate (OS-CAL - DOSED IN MG OF ELEMENTAL CALCIUM) 1250 (500 Ca) MG tablet Take 2 tablets by mouth daily with breakfast.     ezetimibe (ZETIA) 10 MG tablet Take 10 mg by mouth daily.     metFORMIN (GLUCOPHAGE-XR) 500 MG 24 hr tablet Take 500 mg by mouth 2 (two) times daily.     metoprolol succinate (TOPROL-XL) 50 MG 24 hr tablet Take 1 tablet by mouth every day immediately following a meal. Please keep scheduled an appointment for future refills. Thank you. 90 tablet 1   simvastatin (ZOCOR) 20 MG tablet Take 20 mg by mouth daily.     thyroid (ARMOUR) 90 MG tablet Take 90 mg by mouth daily.     Vitamin D, Ergocalciferol, (DRISDOL) 50000 units CAPS capsule Take 50,000 Units by mouth once a week. Thursday  5   No current facility-administered medications for this visit.     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   Past Surgical History:  Procedure Laterality Date   ABDOMINAL HYSTERECTOMY  11/27/2004   Laparoscopic supracervical hysterectomy, not due to cancer   BACK SURGERY     CERVICAL DISCECTOMY  2000s   CESAREAN SECTION  1993; 1998   COLONOSCOPY     ELECTROPHYSIOLOGIC STUDY N/A 02/11/2016   Procedure: A-Fluttter Ablation;  Surgeon: Marinus Maw, MD;  Location: [facility] INVASIVE CV LAB;  Service: Cardiovascular;  Laterality: N/A;   LEFT HEART CATHETERIZATION WITH CORONARY ANGIOGRAM N/A 06/12/2014   Procedure: LEFT HEART CATHETERIZATION WITH CORONARY ANGIOGRAM;  Surgeon: Lesleigh Noe, MD;  Location: [facility] CATH LAB;  Service: Cardiovascular;  Laterality: N/A;   THYROIDECTOMY, PARTIAL Left    "pre-cancerous cells"     Family History  Problem Relation Age of Onset   Diabetes Mellitus I Mother    Hypertension Father    Hyperlipidemia Father    Colon  polyps Father    Heart attack Paternal Grandfather 30   Stroke Paternal Aunt 60   Heart attack Paternal Uncle    Heart attack Cousin 51   Colon cancer Neg Hx    Rectal cancer Neg Hx    Esophageal cancer Neg Hx    Stomach cancer Neg Hx      Social History   Socioeconomic History   Marital status: Married    Spouse name: Not on file   Number of children: Not on file   Years of education: Not on file   Highest education level: Not on file  Occupational History   Not on file  Tobacco Use   Smoking status: Never   Smokeless tobacco: Never  Vaping Use   Vaping status: Never Used  Substance and Sexual Activity   Alcohol use: No    Alcohol/week: 0.0 standard drinks of alcohol   Drug use: No   Sexual activity: Yes    Birth control/protection: None, Surgical  Other Topics  Concern   Not on file  Social History Narrative   Not on file   Social Determinants of Health   Financial Resource Strain: Not on file  Food Insecurity: Not on file  Transportation Needs: Not on file  Physical Activity: Not on file  Stress: Not on file  Social Connections: Not on file  Intimate Partner Violence: Not on file     BP (!) 174/100   Pulse 72   Ht 5\' 4"  (1.626 m)   Wt 171 lb 3.2 oz (77.7 kg)   SpO2 96%   BMI 29.39 kg/m   Physical Exam:  Well appearing NAD HEENT: Unremarkable Neck:  No JVD, no thyromegally Lymphatics:  No adenopathy Back:  No CVA tenderness Lungs:  Clear HEART:  Regular rate rhythm, no murmurs, no rubs, no clicks Abd:  soft, positive bowel sounds, no organomegally, no rebound, no guarding Ext:  2 plus pulses, no edema, no cyanosis, no clubbing Skin:  No rashes no nodules Neuro:  CN II through XII intact, motor grossly intact  EKG  DEVICE  Normal device function.  See PaceArt for details.   Assess/Plan: 1. Atrial flutter - she is doing well, s/p ablation. No evidence of recurrence 2. HTN - her blood pressure is elevated this morning as she did not take her meds and c/o being in a hurry. She assures me that at home it is well controlled.   Loman Chroman Anahli Arvanitis,M.D

## 2023-02-01 NOTE — Patient Instructions (Signed)
 Medication Instructions:  Your physician recommends that you continue on your current medications as directed. Please refer to the Current Medication list given to you today.  *If you need a refill on your cardiac medications before your next appointment, please call your pharmacy*  Lab Work: None ordered.  If you have labs (blood work) drawn today and your tests are completely normal, you will receive your results only by: MyChart Message (if you have MyChart) OR A paper copy in the mail If you have any lab test that is abnormal or we need to change your treatment, we will call you to review the results.  Testing/Procedures: None ordered.  Follow-Up: At [facility], you and your health needs are our priority.  As part of our continuing mission to provide you with exceptional heart care, we have created designated Provider Care Teams.  These Care Teams include your primary Cardiologist (physician) and Advanced Practice Providers (APPs -  Physician Assistants and Nurse Practitioners) who all work together to provide you with the care you need, when you need it.   Your next appointment:   As needed.  The format for your next appointment:   In Person  Provider:   Lewayne Bunting, MD{or one of the following Advanced Practice Providers on your designated Care Team:   Francis Dowse, [place] Casimiro Needle "Mardelle Matte" [place], [place] Earnest Rosier, NP   Important Information About Sugar

## 2023-02-16 ENCOUNTER — Telehealth: Payer: Self-pay | Admitting: Internal Medicine

## 2023-02-16 NOTE — Telephone Encounter (Signed)
Pt c/o BP issue: STAT if pt c/o blurred vision, one-sided weakness or slurred speech  1. What are your last 5 BP readings? 190/100; 165/90:   2. Are you having any other symptoms (ex. Dizziness, headache, blurred vision, passed out)? no  3. What is your BP issue? Running high

## 2023-02-16 NOTE — Telephone Encounter (Signed)
Left message return call

## 2023-02-17 NOTE — Telephone Encounter (Signed)
Spoke with pt who states she checked her BP the evening she saw Dr Ladona Ridgel on 08/20 and received a reading of 190/100.  She rechecked BP yesterday 09/04 with reading of 165/90.  Pt denies any additional symptoms of headache, dizziness, vision problems or weakness.  Pt advised to check her BP daily for 5 days 1-2 hours after taking her morning medications and send readings for further recommendation.  Reviewed ED precautions.  Pt verbalizes understanding and agrees with current plan.

## 2023-02-17 NOTE — Telephone Encounter (Signed)
Patient returned call, please call her at [phone].

## 2023-02-18 ENCOUNTER — Ambulatory Visit: Payer: BC Managed Care – PPO

## 2023-02-18 ENCOUNTER — Encounter: Payer: Self-pay | Admitting: Internal Medicine

## 2023-02-18 ENCOUNTER — Telehealth: Payer: Self-pay | Admitting: Internal Medicine

## 2023-02-18 ENCOUNTER — Other Ambulatory Visit: Payer: Self-pay

## 2023-02-18 VITALS — BP 142/78 | HR 68 | Temp 97.2°F | Resp 14 | Ht 64.0 in | Wt 170.0 lb

## 2023-02-18 DIAGNOSIS — Z8679 Personal history of other diseases of the circulatory system: Secondary | ICD-10-CM | POA: Insufficient documentation

## 2023-02-18 DIAGNOSIS — I10 Essential (primary) hypertension: Secondary | ICD-10-CM | POA: Diagnosis not present

## 2023-02-18 DIAGNOSIS — Z79899 Other long term (current) drug therapy: Secondary | ICD-10-CM

## 2023-02-18 MED ORDER — CARVEDILOL 12.5 MG PO TABS: 12.5000 mg | ORAL_TABLET | Freq: Two times a day (BID) | ORAL | 3 refills | Status: DC

## 2023-02-18 NOTE — Progress Notes (Signed)
Change to Coreg/carvedilol 12.5 mg BID and discontinue metoprolol succinate per A. Tillery PA-C

## 2023-02-18 NOTE — Patient Instructions (Signed)
Please check home Bps with the new medicine Continue low sodium diet, increase exercise Return in December for wellness visit

## 2023-02-18 NOTE — Assessment & Plan Note (Signed)
Appears to be in sinus rhythm today.  Rate controlled. Not on anticoagulation possibly due to low CHA2DS2-VASc score Follows up with cardiology.

## 2023-02-18 NOTE — Telephone Encounter (Signed)
Left message per DPR for Pt stating Otilio Saber PA-C would like to change her to Coreg carvedilol 12.5 mg tablets twice a day and to discontinue the  metoprolol succinate. Pt is to track BP over the weekend and call back on Monday to let us know how she is. Pt also sent in MyChart message. Addressed there as while.

## 2023-02-18 NOTE — Telephone Encounter (Signed)
Patient reported as requested that she took her BP after her medications this morning and the reading was 200/91.

## 2023-02-18 NOTE — Assessment & Plan Note (Signed)
Reported several elevated blood pressures on metoprolol. Patient contacted cardiology prior to this appointment and just before the appointment, the cardiology reached out to her with recommendation to discontinue metoprolol and instead start carvedilol 12.5 mg twice daily.  Today, the second blood pressure in office did come down slightly to 142/78  Plan: Advised to follow recommendations by cardiology and start taking carvedilol 12.5 mg twice daily -Advised to continue to monitor blood pressures couple times daily and make a log -Advised to continue to consume low-sodium diet and increase physical activity and exercise for at least 20 or 30 minutes daily -She could come into the office with her home blood pressure cuff and compare the readings at a nurse visit with our blood pressure machine here to make sure they read similar.

## 2023-02-18 NOTE — Progress Notes (Signed)
Subjective:  Patient ID: Claudia Shelton, female    DOB: 1961-02-07  Age: 62 y.o. MRN: [id number]  Chief Complaint  Patient presents with   Hypertension    HPI  Claudia Shelton is here to discuss Claudia Shelton Bps.   Yesterday, Claudia Shelton checked Claudia Shelton blood pressure 165/90. Claudia Shelton called cardiology office and they recommended to take blood pressure 2 or 4 hours after Claudia Shelton takes Claudia Shelton blood pressure medicine. This morning 200/95 and then 202/98 after Claudia Shelton took metoprolol succinate. They sent a message on mychart app that they will change Claudia Shelton medicine. Claudia Shelton wants to know if Claudia Shelton blood pressure cuff is right.     11/11/2022    9:16 AM 09/02/2021    3:10 PM  Depression screen PHQ 2/9  Decreased Interest 0 0  Down, Depressed, Hopeless 0 0  PHQ - 2 Score 0 0  Altered sleeping 0   Tired, decreased energy 0   Change in appetite 0   Feeling bad or failure about yourself  0   Trouble concentrating 0   Moving slowly or fidgety/restless 0   Suicidal thoughts 0   PHQ-9 Score 0   Difficult doing work/chores Not difficult at all         11/11/2022    9:15 AM  Fall Risk   Falls in the past year? 0  Number falls in past yr: 0  Injury with Fall? 0  Risk for fall due to : No Fall Risks  Follow up Education provided    Patient Care Team: Blane Ohara, Claudia Shelton as PCP - General (Family Medicine) Marinus Maw, Claudia Shelton as PCP - Electrophysiology (Cardiology)   Review of Systems  Constitutional:  Negative for chills, fatigue and fever.  HENT:  Negative for congestion, ear pain and sore throat.   Respiratory:  Negative for cough and shortness of breath.   Cardiovascular:  Negative for chest pain and palpitations.  Gastrointestinal:  Negative for abdominal pain, constipation, diarrhea, nausea and vomiting.  Endocrine: Negative for polydipsia, polyphagia and polyuria.  Genitourinary:  Negative for difficulty urinating and dysuria.  Musculoskeletal:  Negative for arthralgias, back pain and myalgias.  Skin:  Negative for rash.   Neurological:  Negative for headaches.  Psychiatric/Behavioral:  Negative for dysphoric mood. The patient is not nervous/anxious.     Current Outpatient Medications on File Prior to Visit  Medication Sig Dispense Refill   alendronate (FOSAMAX) 70 MG tablet Take 70 mg by mouth once a week.     ARMOUR THYROID 120 MG tablet Take by mouth.     benzonatate (TESSALON) 100 MG capsule Take 1 capsule (100 mg total) by mouth 2 (two) times daily as needed for cough. 20 capsule 0   calcium carbonate (OS-CAL - DOSED IN MG OF ELEMENTAL CALCIUM) 1250 (500 Ca) MG tablet Take 2 tablets by mouth daily with breakfast.     ezetimibe (ZETIA) 10 MG tablet Take 10 mg by mouth daily.     metFORMIN (GLUCOPHAGE-XR) 500 MG 24 hr tablet Take 500 mg by mouth 2 (two) times daily.     simvastatin (ZOCOR) 20 MG tablet Take 20 mg by mouth daily.     thyroid (ARMOUR) 90 MG tablet Take 90 mg by mouth daily.     Vitamin D, Ergocalciferol, (DRISDOL) 50000 units CAPS capsule Take 50,000 Units by mouth once a week. Thursday  5   No current facility-administered medications on file prior to visit.   Past Medical History:  Diagnosis Date   Atrial fibrillation (HCC)  BMI 28.0-28.9,adult    Chest pain on exertion 2015   Heart murmur    Hyperlipidemia    Hypertension    Hypothyroidism    half of thyroid removed d/t large goiter   Impaired fasting glucose    Osteoporosis    Palpitations    Pleuritic pain    right lung base   PONV (postoperative nausea and vomiting)    SOB (shortness of breath) on exertion 2015   TMJ (dislocation of temporomandibular joint)    "not as bad as it was; very minor now" (02/11/2016)   Type II diabetes mellitus (HCC)    "they say I'm prediabetic but I'm on RX" (02/11/2016)   Past Surgical History:  Procedure Laterality Date   ABDOMINAL HYSTERECTOMY  11/27/2004   Laparoscopic supracervical hysterectomy, not due to cancer   BACK SURGERY     CERVICAL DISCECTOMY  2000s   CESAREAN SECTION   1993; 1998   COLONOSCOPY     ELECTROPHYSIOLOGIC STUDY N/A 02/11/2016   Procedure: A-Fluttter Ablation;  Surgeon: Marinus Maw, Claudia Shelton;  Location: [facility] INVASIVE CV LAB;  Service: Cardiovascular;  Laterality: N/A;   LEFT HEART CATHETERIZATION WITH CORONARY ANGIOGRAM N/A 06/12/2014   Procedure: LEFT HEART CATHETERIZATION WITH CORONARY ANGIOGRAM;  Surgeon: Lesleigh Noe, Claudia Shelton;  Location: [facility] CATH LAB;  Service: Cardiovascular;  Laterality: N/A;   THYROIDECTOMY, PARTIAL Left    "pre-cancerous cells"    Family History  Problem Relation Age of Onset   Diabetes Mellitus I Mother    Hypertension Father    Hyperlipidemia Father    Colon polyps Father    Heart attack Paternal Grandfather 44   Stroke Paternal Aunt 57   Heart attack Paternal Uncle    Heart attack Cousin 51   Colon cancer Neg Hx    Rectal cancer Neg Hx    Esophageal cancer Neg Hx    Stomach cancer Neg Hx    Social History   Socioeconomic History   Marital status: Married    Spouse name: Not on file   Number of children: Not on file   Years of education: Not on file   Highest education level: Not on file  Occupational History   Not on file  Tobacco Use   Smoking status: Never   Smokeless tobacco: Never  Vaping Use   Vaping status: Never Used  Substance and Sexual Activity   Alcohol use: No    Alcohol/week: 0.0 standard drinks of alcohol   Drug use: No   Sexual activity: Yes    Birth control/protection: None, Surgical  Other Topics Concern   Not on file  Social History Narrative   Not on file   Social Determinants of Health   Financial Resource Strain: Not on file  Food Insecurity: Not on file  Transportation Needs: Not on file  Physical Activity: Not on file  Stress: Not on file  Social Connections: Not on file    Objective:  BP (!) 142/78   Pulse 68   Temp (!) 97.2 F (36.2 C)   Resp 14   Ht 5\' 4"  (1.626 m)   Wt 170 lb (77.1 kg)   SpO2 97%   BMI 29.18 kg/m      02/18/2023   10:59 AM 02/18/2023    10:20 AM 02/01/2023    8:47 AM  BP/Weight  Systolic BP 142 160 174  Diastolic BP 78 70 100  Wt. (Lbs)  170 171.2  BMI  29.18 kg/m2 29.39 kg/m2  Physical Exam Constitutional:      Appearance: Normal appearance.  HENT:     Head: Normocephalic and atraumatic.     Mouth/Throat:     Mouth: Mucous membranes are moist.  Cardiovascular:     Rate and Rhythm: Normal rate and regular rhythm.  Pulmonary:     Effort: Pulmonary effort is normal.     Breath sounds: Normal breath sounds.  Musculoskeletal:        General: Normal range of motion.  Neurological:     General: No focal deficit present.     Mental Status: Claudia Shelton is alert and oriented to person, place, and time.     Diabetic Foot Exam - Simple   No data filed      Lab Results  Component Value Date   WBC 5.9 01/30/2016   HGB 14.6 01/30/2016   HCT 42.9 01/30/2016   PLT 242 01/30/2016   GLUCOSE 106 (H) 01/30/2016   NA 139 01/30/2016   K 4.1 01/30/2016   CL 102 01/30/2016   CREATININE 0.81 01/30/2016   BUN 16 01/30/2016   CO2 24 01/30/2016   TSH 5.20 (H) 01/13/2016   INR 0.93 06/12/2014      Assessment & Plan:    Uncontrolled hypertension, stage 1 Assessment & Plan: Reported several elevated blood pressures on metoprolol. Patient contacted cardiology prior to this appointment and just before the appointment, the cardiology reached out to Claudia Shelton with recommendation to discontinue metoprolol and instead start carvedilol 12.5 mg twice daily.  Today, the second blood pressure in office did come down slightly to 142/78  Plan: Advised to follow recommendations by cardiology and start taking carvedilol 12.5 mg twice daily -Advised to continue to monitor blood pressures couple times daily and make a log -Advised to continue to consume low-sodium diet and increase physical activity and exercise for at least 20 or 30 minutes daily -Claudia Shelton could come into the office with Claudia Shelton home blood pressure cuff and compare the readings at a  nurse visit with our blood pressure machine here to make sure they read similar.    Atrial fibrillation, currently in sinus rhythm Assessment & Plan: Appears to be in sinus rhythm today.  Rate controlled. Not on anticoagulation possibly due to low CHA2DS2-VASc score Follows up with cardiology.      No orders of the defined types were placed in this encounter.   No orders of the defined types were placed in this encounter.    Follow-up: Return in about 3 months (around 05/20/2023).   I,Claudia Shelton,acting as a scribe for [employer], Claudia Shelton.,have documented all relevant documentation on the behalf of Claudia Moment, Claudia Shelton,as directed by  Claudia Moment, Claudia Shelton while in the presence of [employer]ia [employer], Claudia Shelton.   An After Visit Summary was printed and given to the patient.  Claudia Moment, Claudia Shelton [facility] [phone]

## 2023-02-23 ENCOUNTER — Other Ambulatory Visit: Payer: Self-pay

## 2023-02-23 MED ORDER — AMLODIPINE BESYLATE 5 MG PO TABS: 5.0000 mg | ORAL_TABLET | Freq: Every day | ORAL | 3 refills | Status: DC

## 2023-03-16 ENCOUNTER — Other Ambulatory Visit: Payer: Self-pay

## 2023-03-16 ENCOUNTER — Other Ambulatory Visit (INDEPENDENT_AMBULATORY_CARE_PROVIDER_SITE_OTHER): Payer: BC Managed Care – PPO

## 2023-03-16 DIAGNOSIS — E039 Hypothyroidism, unspecified: Secondary | ICD-10-CM

## 2023-03-16 LAB — TSH: TSH: 2.04 u[IU]/mL (ref 0.35–5.50)

## 2023-03-16 LAB — T4, FREE: Free T4: 0.69 ng/dL (ref 0.60–1.60)

## 2023-03-21 MED ORDER — NEBIVOLOL HCL 10 MG PO TABS: 10.0000 mg | ORAL_TABLET | Freq: Every day | ORAL | 3 refills | Status: DC

## 2023-03-23 ENCOUNTER — Ambulatory Visit: Payer: BC Managed Care – PPO | Admitting: "Endocrinology

## 2023-03-23 ENCOUNTER — Encounter: Payer: Self-pay | Admitting: "Endocrinology

## 2023-03-23 VITALS — BP 140/70 | HR 76 | Resp 20 | Ht 64.0 in | Wt 170.6 lb

## 2023-03-23 DIAGNOSIS — E039 Hypothyroidism, unspecified: Secondary | ICD-10-CM

## 2023-03-23 DIAGNOSIS — E890 Postprocedural hypothyroidism: Secondary | ICD-10-CM

## 2023-03-23 LAB — T3, FREE: T3, Free: 4.3 pg/mL — ABNORMAL HIGH (ref 2.3–4.2)

## 2023-03-23 MED ORDER — LEVOTHYROXINE SODIUM 175 MCG PO TABS: 175.0000 ug | ORAL_TABLET | Freq: Every day | ORAL | 3 refills | Status: DC

## 2023-03-23 MED ORDER — ARMOUR THYROID 120 MG PO TABS: ORAL_TABLET | ORAL | Status: DC

## 2023-03-23 MED ORDER — THYROID 90 MG PO TABS: ORAL_TABLET | ORAL | Status: DC

## 2023-03-23 NOTE — Progress Notes (Signed)
Outpatient Endocrinology Note Claudia Arnett, MD  03/23/23   Claudia Shelton August 25, 1960 [id number]  Referring Provider: Early Osmond, NP Primary Care Provider: Blane Ohara, MD Subjective  No chief complaint on file.   Assessment & Plan  Diagnoses and all orders for this visit:  Acquired hypothyroidism -     T3, free; Future -     T3, free  Status post partial thyroidectomy  Other orders -     Discontinue: thyroid (ARMOUR) 90 MG tablet; 3 days of the week -     Discontinue: ARMOUR THYROID 120 MG tablet; 4 days of the week -     levothyroxine (SYNTHROID) 175 MCG tablet; Take 1 tablet (175 mcg total) by mouth daily.    Claudia Shelton is currently taking On alternating dose of armour thyroid 120 mg and 90 mg armour thyroid that patient has been happy with, except reports it is expensive and patient gets palpitations which could be coming from it. Patient would like to switch to levothyroxine.  Patient is currently biochemically euthyroid.  Educated on thyroid axis.  Recommend the following: Take levothyroxine 175 mcg po every morning (dose equivalent of armour 120 mg four days and 90 mg three days a week) .  Advised to take levothyroxine first thing in the morning on empty stomach and wait at least 30 minutes to 1 hour before eating or drinking anything or taking any other medications. Space out levothyroxine by 4 hours from any acid reflux medication/fibrate/iron/calcium/multivitamin. Advised to take birth control pills and nutritional supplements in the evening. Repeat lab before next visit or sooner if symptoms of hyperthyroidism or hypothyroidism develop.  Notify us immediately in case of pregnancy/breastfeeding or significant weight gain or loss. Counseled on compliance and follow up needs.  I have reviewed current medications, nurse's notes, allergies, vital signs, past medical and surgical history, family medical history, and social history for this encounter. Counseled  patient on symptoms, examination findings, lab findings, imaging results, treatment decisions and monitoring and prognosis. The patient understood the recommendations and agrees with the treatment plan. All questions regarding treatment plan were fully answered.   Return in about 30 days (around 04/22/2023) for tele-visit: 10:40 am, labs before next visit, labs today.   Claudia Harrah, MD  03/23/23   I have reviewed current medications, nurse's notes, allergies, vital signs, past medical and surgical history, family medical history, and social history for this encounter. Counseled patient on symptoms, examination findings, lab findings, imaging results, treatment decisions and monitoring and prognosis. The patient understood the recommendations and agrees with the treatment plan. All questions regarding treatment plan were fully answered.   History of Present Illness Claudia Shelton is a 62 y.o. year old female who presents to our clinic with hypothyroidism diagnosed around 2004.  S/p L thyroidectomy for "pre-cancerous" cell around 2009 in [place] in [facility]  Tried synthroid in past but "it did not work" so she has been on armour thyroid since 2011. On alternating dose of armour thyroid 120 mg and 90 mg armour thyroid that patient has been happy with, except reports it is expensive and patient gets palpitations which could be coming from it.  Symptoms suggestive of HYPOTHYROIDISM:  fatigue No weight gain No cold intolerance  No constipation  No  Symptoms suggestive of HYPERTHYROIDISM:  weight loss  No heat intolerance No hyperdefecation  No palpitations  Yes  Compressive symptoms:  dysphagia  No dysphonia  No positional dyspnea (especially with simultaneous  arms elevation)  No  Smokes  No On biotin  No  Physical Exam  BP (!) 140/70 (BP Location: Left Arm, Patient Position: Sitting, Cuff Size: Large)   Pulse 76   Resp 20   Ht 5\' 4"  (1.626 m)   Wt 170 lb 9.6 oz (77.4  kg)   SpO2 97%   BMI 29.28 kg/m  Constitutional: well developed, well nourished Head: normocephalic, atraumatic, no exophthalmos Eyes: sclera anicteric, no redness Neck: no thyromegaly, no thyroid tenderness; no nodules palpated. Well healed old thyroidectomy scar  Lungs: normal respiratory effort Neurology: alert and oriented, no fine hand tremor Skin: dry, no appreciable rashes Musculoskeletal: no appreciable defects Psychiatric: normal mood and affect  Allergies Allergies  Allergen Reactions   Adhesive [Tape] Hives and Itching   Almond Oil Itching and Swelling   Latex Hives    Current Medications Patient's Medications  New Prescriptions   LEVOTHYROXINE (SYNTHROID) 175 MCG TABLET    Take 1 tablet (175 mcg total) by mouth daily.  Previous Medications   ALENDRONATE (FOSAMAX) 70 MG TABLET    Take 70 mg by mouth once a week.   AMLODIPINE (NORVASC) 5 MG TABLET    Take 1 tablet (5 mg total) by mouth daily.   BENZONATATE (TESSALON) 100 MG CAPSULE    Take 1 capsule (100 mg total) by mouth 2 (two) times daily as needed for cough.   CALCIUM CARBONATE (OS-CAL - DOSED IN MG OF ELEMENTAL CALCIUM) 1250 (500 CA) MG TABLET    Take 2 tablets by mouth daily with breakfast.   EZETIMIBE (ZETIA) 10 MG TABLET    Take 10 mg by mouth daily.   METFORMIN (GLUCOPHAGE-XR) 500 MG 24 HR TABLET    Take 500 mg by mouth 2 (two) times daily.   NEBIVOLOL (BYSTOLIC) 10 MG TABLET    Take 1 tablet (10 mg total) by mouth daily.   SIMVASTATIN (ZOCOR) 20 MG TABLET    Take 20 mg by mouth daily.   VITAMIN D, ERGOCALCIFEROL, (DRISDOL) 50000 UNITS CAPS CAPSULE    Take 50,000 Units by mouth once a week. Thursday  Modified Medications   No medications on file  Discontinued Medications   ARMOUR THYROID 120 MG TABLET    Take by mouth.   THYROID (ARMOUR) 90 MG TABLET    Take 90 mg by mouth daily.    Past Medical History Past Medical History:  Diagnosis Date   Atrial fibrillation (HCC)    BMI 28.0-28.9,adult    Chest  pain on exertion 2015   Heart murmur    Hyperlipidemia    Hypertension    Hypothyroidism    half of thyroid removed d/t large goiter   Impaired fasting glucose    Osteoporosis    Palpitations    Pleuritic pain    right lung base   PONV (postoperative nausea and vomiting)    SOB (shortness of breath) on exertion 2015   TMJ (dislocation of temporomandibular joint)    "not as bad as it was; very minor now" (02/11/2016)   Type II diabetes mellitus (HCC)    "they say I'm prediabetic but I'm on RX" (02/11/2016)    Past Surgical History Past Surgical History:  Procedure Laterality Date   ABDOMINAL HYSTERECTOMY  11/27/2004   Laparoscopic supracervical hysterectomy, not due to cancer   BACK SURGERY     CERVICAL DISCECTOMY  2000s   CESAREAN SECTION  1993; 1998   COLONOSCOPY     ELECTROPHYSIOLOGIC STUDY N/A 02/11/2016   Procedure:  A-Fluttter Ablation;  Surgeon: Marinus Maw, MD;  Location: [facility] INVASIVE CV LAB;  Service: Cardiovascular;  Laterality: N/A;   LEFT HEART CATHETERIZATION WITH CORONARY ANGIOGRAM N/A 06/12/2014   Procedure: LEFT HEART CATHETERIZATION WITH CORONARY ANGIOGRAM;  Surgeon: Lesleigh Noe, MD;  Location: [facility] CATH LAB;  Service: Cardiovascular;  Laterality: N/A;   THYROIDECTOMY, PARTIAL Left    "pre-cancerous cells"    Family History family history includes Colon polyps in her father; Diabetes Mellitus I in her mother; Heart attack in her paternal uncle; Heart attack (age of onset: 37) in her cousin; Heart attack (age of onset: 52) in her paternal grandfather; Hyperlipidemia in her father; Hypertension in her father; Stroke (age of onset: 6) in her paternal aunt.  Social History Social History   Socioeconomic History   Marital status: Married    Spouse name: Not on file   Number of children: Not on file   Years of education: Not on file   Highest education level: Not on file  Occupational History   Not on file  Tobacco Use   Smoking status: Never    Smokeless tobacco: Never  Vaping Use   Vaping status: Never Used  Substance and Sexual Activity   Alcohol use: No    Alcohol/week: 0.0 standard drinks of alcohol   Drug use: No   Sexual activity: Yes    Birth control/protection: None, Surgical  Other Topics Concern   Not on file  Social History Narrative   Not on file   Social Determinants of Health   Financial Resource Strain: Not on file  Food Insecurity: Not on file  Transportation Needs: Not on file  Physical Activity: Not on file  Stress: Not on file  Social Connections: Not on file  Intimate Partner Violence: Not on file    Laboratory Investigations Lab Results  Component Value Date   TSH 2.04 03/16/2023   TSH 5.20 (H) 01/13/2016   FREET4 0.69 03/16/2023     No results found for: "TSI"   No components found for: "TRAB"   No results found for: "CHOL" No results found for: "HDL" No results found for: "LDLCALC" No results found for: "TRIG" No results found for: "CHOLHDL" Lab Results  Component Value Date   CREATININE 0.81 01/30/2016   No results found for: "GFR"    Component Value Date/Time   NA 139 01/30/2016 0735   K 4.1 01/30/2016 0735   CL 102 01/30/2016 0735   CO2 24 01/30/2016 0735   GLUCOSE 106 (H) 01/30/2016 0735   BUN 16 01/30/2016 0735   CREATININE 0.81 01/30/2016 0735   CALCIUM 9.6 01/30/2016 0735   GFRNONAA >60 01/11/2016 0150   GFRAA >60 01/11/2016 0150      Latest Ref Rng & Units 01/30/2016    7:35 AM 01/13/2016   12:23 PM 01/11/2016    1:50 AM  BMP  Glucose 65 - 99 mg/dL 604  88  540   BUN 7 - 25 mg/dL 16  13  18    Creatinine 0.50 - 1.05 mg/dL 9.81  1.91  4.78   Sodium 135 - 146 mmol/L 139  137  137   Potassium 3.5 - 5.3 mmol/L 4.1  4.3  3.3   Chloride 98 - 110 mmol/L 102  101  99   CO2 20 - 31 mmol/L 24  26  25    Calcium 8.6 - 10.4 mg/dL 9.6  29.5  62.1        Component Value Date/Time  WBC 5.9 01/30/2016 0735   RBC 4.86 01/30/2016 0735   HGB 14.6 01/30/2016 0735   HCT 42.9  01/30/2016 0735   PLT 242 01/30/2016 0735   MCV 88.3 01/30/2016 0735   MCH 30.0 01/30/2016 0735   MCHC 34.0 01/30/2016 0735   RDW 13.2 01/30/2016 0735   LYMPHSABS 1,475 01/30/2016 0735   MONOABS 413 01/30/2016 0735   EOSABS 177 01/30/2016 0735   BASOSABS 59 01/30/2016 0735      Parts of this note may have been dictated using voice recognition software. There may be variances in spelling and vocabulary which are unintentional. Not all errors are proofread. Please notify the Thereasa Parkin if any discrepancies are noted or if the meaning of any statement is not clear.

## 2023-03-24 ENCOUNTER — Other Ambulatory Visit: Payer: Self-pay | Admitting: "Endocrinology

## 2023-03-24 DIAGNOSIS — E039 Hypothyroidism, unspecified: Secondary | ICD-10-CM

## 2023-04-20 ENCOUNTER — Other Ambulatory Visit: Payer: Self-pay

## 2023-04-20 ENCOUNTER — Other Ambulatory Visit (INDEPENDENT_AMBULATORY_CARE_PROVIDER_SITE_OTHER): Payer: BC Managed Care – PPO

## 2023-04-20 DIAGNOSIS — J069 Acute upper respiratory infection, unspecified: Secondary | ICD-10-CM

## 2023-04-20 DIAGNOSIS — E039 Hypothyroidism, unspecified: Secondary | ICD-10-CM

## 2023-04-20 LAB — TSH: TSH: 0.17 u[IU]/mL — ABNORMAL LOW (ref 0.35–5.50)

## 2023-04-20 LAB — T3, FREE: T3, Free: 3.6 pg/mL (ref 2.3–4.2)

## 2023-04-20 LAB — T4, FREE: Free T4: 1.68 ng/dL — ABNORMAL HIGH (ref 0.60–1.60)

## 2023-04-20 NOTE — Addendum Note (Signed)
Addended by: Lars Mage on: 04/20/2023 03:27 PM   Modules accepted: Orders

## 2023-04-20 NOTE — Telephone Encounter (Signed)
Nahser, Deloris Ping, MD  You4 hours ago (10:49 AM)   I have limited access to her chart ( phone only) I cannot see her renal function, potassium levels.  I also cannot see her most recent visit with me .If renal function and potassium  are reasonably ok , please add Losartan 50 mg a day.  Add bmp to an work - can get the day before our visit  on nov 22 Thanks  PN   Order placed for BMET and pt made aware. After resulted, will consider adding Losartan.

## 2023-04-22 ENCOUNTER — Telehealth: Payer: BC Managed Care – PPO | Admitting: "Endocrinology

## 2023-04-22 LAB — BASIC METABOLIC PANEL
BUN/Creatinine Ratio: 18 (ref 12–28)
BUN: 15 mg/dL (ref 8–27)
CO2: 21 mmol/L (ref 20–29)
Calcium: 9.5 mg/dL (ref 8.7–10.3)
Chloride: 101 mmol/L (ref 96–106)
Creatinine, Ser: 0.83 mg/dL (ref 0.57–1.00)
Glucose: 99 mg/dL (ref 70–99)
Potassium: 4.1 mmol/L (ref 3.5–5.2)
Sodium: 138 mmol/L (ref 134–144)
eGFR: 79 mL/min/{1.73_m2} (ref >59)

## 2023-04-26 ENCOUNTER — Telehealth: Payer: Self-pay | Admitting: Internal Medicine

## 2023-04-26 MED ORDER — LOSARTAN POTASSIUM 50 MG PO TABS: 50.0000 mg | ORAL_TABLET | Freq: Every day | ORAL | 3 refills | Status: DC

## 2023-04-26 NOTE — Telephone Encounter (Signed)
Losartan sent to pharmacy on file and pt made aware via MyChart. Repeat BMET entered at this time.

## 2023-04-26 NOTE — Addendum Note (Signed)
Addended by: Lars Mage on: 04/26/2023 04:05 PM   Modules accepted: Orders

## 2023-04-26 NOTE — Telephone Encounter (Signed)
Pt c/o BP issue: STAT if pt c/o blurred vision, one-sided weakness or slurred speech  1. What are your last 5 BP readings? 160/83, 171/81  2. Are you having any other symptoms (ex. Dizziness, headache, blurred vision, passed out)? headache  3. What is your BP issue? Pt's bp is still elevated.

## 2023-04-26 NOTE — Telephone Encounter (Signed)
Completed via MyChart encounter. 

## 2023-04-27 ENCOUNTER — Telehealth (INDEPENDENT_AMBULATORY_CARE_PROVIDER_SITE_OTHER): Payer: BC Managed Care – PPO | Admitting: "Endocrinology

## 2023-04-27 ENCOUNTER — Encounter: Payer: Self-pay | Admitting: "Endocrinology

## 2023-04-27 DIAGNOSIS — E039 Hypothyroidism, unspecified: Secondary | ICD-10-CM

## 2023-04-27 DIAGNOSIS — E890 Postprocedural hypothyroidism: Secondary | ICD-10-CM

## 2023-04-27 NOTE — Progress Notes (Signed)
The patient reports they are currently: Claudia Shelton. This was a video with the patient on the date of service.   The patient was physically located in [place] or a state in which I am permitted to provide care. The patient and/or parent/guardian understood that s/he may incur co-pays and cost sharing, and agreed to the telemedicine visit. The visit was reasonable and appropriate under the circumstances given the patient's presentation at the time.  The patient and/or parent/guardian has been advised of the potential risks and limitations of this mode of treatment (including, but not limited to, the absence of in-person examination) and has agreed to be treated using telemedicine. The patient's/patient's family's questions regarding telemedicine have been answered.   The patient and/or parent/guardian has also been advised to contact their provider's office for worsening conditions, and seek emergency medical treatment and/or call 911 if the patient deems either necessary.     Outpatient Endocrinology Note Altamese Richfield, MD  04/27/23   Claudia Shelton February 22, 1961 [id number]  Referring Provider: Blane Ohara, MD Primary Care Provider: Blane Ohara, MD Subjective  Chief Complaint  Patient presents with   Hypothyroidism    Assessment & Plan  [place] was seen today for hypothyroidism.  Diagnoses and all orders for this visit:  Acquired hypothyroidism -     TSH Rfx on Abnormal to Free T4; Future  Status post partial thyroidectomy    Claudia Shelton is currently taking levothyroxine 175 mcg po every morning.  Previously on alternating dose of armour thyroid 120 mg and 90 mg armour thyroid that was switched to levothyroxine on patient request due to expense and patient palpitations. Patient has history of atrial flutter. Doesn't feel any different on levothyroxine than armour.  Patient is currently biochemically euthyroid.  Educated on thyroid axis.  Recommend the following: Take  levothyroxine 175 mcg po every morning, except half a tablet on Sunday. Advised to take levothyroxine first thing in the morning on empty stomach and wait at least 30 minutes to 1 hour before eating or drinking anything or taking any other medications. Space out levothyroxine by 4 hours from any acid reflux medication/fibrate/iron/calcium/multivitamin. Advised to take birth control pills and nutritional supplements in the evening. Repeat lab before next visit or sooner if symptoms of hyperthyroidism or hypothyroidism develop.  Notify us immediately in case of significant weight gain or loss. Counseled on compliance and follow up needs.  I have reviewed current medications, nurse's notes, allergies, vital signs, past medical and surgical history, family medical history, and social history for this encounter. Counseled patient on symptoms, examination findings, lab findings, imaging results, treatment decisions and monitoring and prognosis. The patient understood the recommendations and agrees with the treatment plan. All questions regarding treatment plan were fully answered.   Return in about 3 months (around 07/19/2023) for visit + labs before next visit.   Altamese Thunderbolt, MD  04/27/23  History of Present Illness Claudia Shelton is a 62 y.o. year old female who presents to follow up for hypothyroidism diagnosed around 2004.  S/p L thyroidectomy for "pre-cancerous" cell around 2009 in [place] in [facility]  Tried synthroid in past but "it did not work" so she has been on armour thyroid since 2011. On alternating dose of armour thyroid 120 mg and 90 mg armour thyroid that patient has been happy with, except reports it is expensive and patient gets palpitations which could be coming from it.  Symptoms suggestive of HYPOTHYROIDISM:  fatigue No weight gain No cold intolerance  No constipation  No  Symptoms suggestive of HYPERTHYROIDISM:  weight loss  No heat intolerance No hyperdefecation   No palpitations  Yes  Compressive symptoms:  dysphagia  No dysphonia  No positional dyspnea (especially with simultaneous arms elevation)  No c/o SOB at rest/exertion   Smokes  No On biotin  No  Physical Exam  There were no vitals taken for this visit. Constitutional: well developed, well nourished Head: normocephalic, atraumatic, no exophthalmos Eyes: sclera anicteric, no redness Neck: no thyromegaly, no thyroid tenderness; no nodules palpated. Well healed old thyroidectomy scar  Lungs: normal respiratory effort Neurology: alert and oriented, no fine hand tremor Skin: dry, no appreciable rashes Musculoskeletal: no appreciable defects Psychiatric: normal mood and affect  Allergies Allergies  Allergen Reactions   Adhesive [Tape] Hives and Itching   Almond Oil Itching and Swelling   Latex Hives    Current Medications Patient's Medications  New Prescriptions   No medications on file  Previous Medications   ALENDRONATE (FOSAMAX) 70 MG TABLET    Take 70 mg by mouth once a week.   AMLODIPINE (NORVASC) 5 MG TABLET    Take 1 tablet (5 mg total) by mouth daily.   BENZONATATE (TESSALON) 100 MG CAPSULE    Take 1 capsule (100 mg total) by mouth 2 (two) times daily as needed for cough.   CALCIUM CARBONATE (OS-CAL - DOSED IN MG OF ELEMENTAL CALCIUM) 1250 (500 CA) MG TABLET    Take 2 tablets by mouth daily with breakfast.   EZETIMIBE (ZETIA) 10 MG TABLET    Take 10 mg by mouth daily.   LEVOTHYROXINE (SYNTHROID) 175 MCG TABLET    Take 1 tablet (175 mcg total) by mouth daily.   LOSARTAN (COZAAR) 50 MG TABLET    Take 1 tablet (50 mg total) by mouth daily.   METFORMIN (GLUCOPHAGE-XR) 500 MG 24 HR TABLET    Take 500 mg by mouth 2 (two) times daily.   NEBIVOLOL (BYSTOLIC) 10 MG TABLET    Take 1 tablet (10 mg total) by mouth daily.   SIMVASTATIN (ZOCOR) 20 MG TABLET    Take 20 mg by mouth daily.   VITAMIN D, ERGOCALCIFEROL, (DRISDOL) 50000 UNITS CAPS CAPSULE    Take 50,000 Units by mouth  once a week. Thursday  Modified Medications   No medications on file  Discontinued Medications   No medications on file    Past Medical History Past Medical History:  Diagnosis Date   Atrial fibrillation (HCC)    BMI 28.0-28.9,adult    Chest pain on exertion 2015   Heart murmur    Hyperlipidemia    Hypertension    Hypothyroidism    half of thyroid removed d/t large goiter   Impaired fasting glucose    Osteoporosis    Palpitations    Pleuritic pain    right lung base   PONV (postoperative nausea and vomiting)    SOB (shortness of breath) on exertion 2015   TMJ (dislocation of temporomandibular joint)    "not as bad as it was; very minor now" (02/11/2016)   Type II diabetes mellitus (HCC)    "they say I'm prediabetic but I'm on RX" (02/11/2016)    Past Surgical History Past Surgical History:  Procedure Laterality Date   ABDOMINAL HYSTERECTOMY  11/27/2004   Laparoscopic supracervical hysterectomy, not due to cancer   BACK SURGERY     CERVICAL DISCECTOMY  2000s   CESAREAN SECTION  1993; 1998   COLONOSCOPY     ELECTROPHYSIOLOGIC  STUDY N/A 02/11/2016   Procedure: A-Fluttter Ablation;  Surgeon: Marinus Maw, MD;  Location: [facility] INVASIVE CV LAB;  Service: Cardiovascular;  Laterality: N/A;   LEFT HEART CATHETERIZATION WITH CORONARY ANGIOGRAM N/A 06/12/2014   Procedure: LEFT HEART CATHETERIZATION WITH CORONARY ANGIOGRAM;  Surgeon: Lesleigh Noe, MD;  Location: [facility] C[facility] LAB;  Service: Cardiovascular;  Laterality: N/A;   THYROIDECTOMY, PARTIAL Left    "pre-cancerous cells"    Family History family history includes Colon polyps in her father; Diabetes Mellitus I in her mother; Heart attack in her paternal uncle; Heart attack (age of onset: 2) in her cousin; Heart attack (age of onset: 39) in her paternal grandfather; Hyperlipidemia in her father; Hypertension in her father; Stroke (age of onset: 6) in her paternal aunt.  Social History Social History   Socioeconomic  History   Marital status: Married    Spouse name: Not on file   Number of children: Not on file   Years of education: Not on file   Highest education level: Not on file  Occupational History   Not on file  Tobacco Use   Smoking status: Never   Smokeless tobacco: Never  Vaping Use   Vaping status: Never Used  Substance and Sexual Activity   Alcohol use: No    Alcohol/week: 0.0 standard drinks of alcohol   Drug use: No   Sexual activity: Yes    Birth control/protection: None, Surgical  Other Topics Concern   Not on file  Social History Narrative   Not on file   Social Determinants of Health   Financial Resource Strain: Not on file  Food Insecurity: Not on file  Transportation Needs: Not on file  Physical Activity: Not on file  Stress: Not on file  Social Connections: Not on file  Intimate Partner Violence: Not on file    Laboratory Investigations Lab Results  Component Value Date   TSH 0.17 (L) 04/20/2023   TSH 2.04 03/16/2023   TSH 5.20 (H) 01/13/2016   FREET4 1.68 (H) 04/20/2023   FREET4 0.69 03/16/2023     No results found for: "TSI"   No components found for: "TRAB"   No results found for: "CHOL" No results found for: "HDL" No results found for: "LDLCALC" No results found for: "TRIG" No results found for: "CHOLHDL" Lab Results  Component Value Date   CREATININE 0.83 04/21/2023   No results found for: "GFR"    Component Value Date/Time   NA 138 04/21/2023 1113   K 4.1 04/21/2023 1113   CL 101 04/21/2023 1113   CO2 21 04/21/2023 1113   GLUCOSE 99 04/21/2023 1113   GLUCOSE 106 (H) 01/30/2016 0735   BUN 15 04/21/2023 1113   CREATININE 0.83 04/21/2023 1113   CREATININE 0.81 01/30/2016 0735   CALCIUM 9.5 04/21/2023 1113   GFRNONAA >60 01/11/2016 0150   GFRAA >60 01/11/2016 0150      Latest Ref Rng & Units 04/21/2023   11:13 AM 01/30/2016    7:35 AM 01/13/2016   12:23 PM  BMP  Glucose 70 - 99 mg/dL 99  355  88   BUN 8 - 27 mg/dL 15  16  13     Creatinine 0.57 - 1.00 mg/dL 7.32  2.02  5.42   BUN/Creat Ratio 12 - 28 18     Sodium 134 - 144 mmol/L 138  139  137   Potassium 3.5 - 5.2 mmol/L 4.1  4.1  4.3   Chloride 96 - 106 mmol/L 101  102  101   CO2 20 - 29 mmol/L 21  24  26    Calcium 8.7 - 10.3 mg/dL 9.5  9.6  16.1        Component Value Date/Time   WBC 5.9 01/30/2016 0735   RBC 4.86 01/30/2016 0735   HGB 14.6 01/30/2016 0735   HCT 42.9 01/30/2016 0735   PLT 242 01/30/2016 0735   MCV 88.3 01/30/2016 0735   MCH 30.0 01/30/2016 0735   MCHC 34.0 01/30/2016 0735   RDW 13.2 01/30/2016 0735   LYMPHSABS 1,475 01/30/2016 0735   MONOABS 413 01/30/2016 0735   EOSABS 177 01/30/2016 0735   BASOSABS 59 01/30/2016 0735      Parts of this note may have been dictated using voice recognition software. There may be variances in spelling and vocabulary which are unintentional. Not all errors are proofread. Please notify the There[facility] Parkin if any discrepancies are noted or if the meaning of any statement is not clear.

## 2023-05-04 ENCOUNTER — Encounter: Payer: Self-pay | Admitting: Cardiovascular Disease

## 2023-05-04 NOTE — Progress Notes (Signed)
  Cardiology Office Note:  .   Date:  05/06/2023  ID:  Claudia Shelton, DOB Aug 25, 1960, MRN [id number] PCP: Blane Ohara, MD  [facility] Providers Cardiologist:  None Electrophysiologist:  Lewayne Bunting, MD    History of Present Illness: . Nov. 22, 2024   Claudia Shelton is a 62 y.o. female with hx of HTN  I am meeting her for the first time today   She has been followed by Dr Ladona Ridgel  She has been having more issues with her BP We started Bystolic 10 Q AM Losartan 50 mg in the evening   Still eats a fairly high salt diet   Walks at work  Systems analyst at [employer]  No true cardio exercise     ROS:   Studies Reviewed: .         Risk Assessment/Calculations:     HYPERTENSION CONTROL Vitals:   05/06/23 1506 05/06/23 1531  BP: (!) 156/84 (!) 145/80    The patient's blood pressure is elevated above target today.  In order to address the patient's elevated BP: A new medication was prescribed today.          Physical Exam:   VS:  BP (!) 145/80   Pulse 70   Ht 5\' 4"  (1.626 m)   Wt 170 lb 9.6 oz (77.4 kg)   SpO2 96%   BMI 29.28 kg/m    Wt Readings from Last 3 Encounters:  05/06/23 170 lb 9.6 oz (77.4 kg)  03/23/23 170 lb 9.6 oz (77.4 kg)  02/18/23 170 lb (77.1 kg)    GEN: Well nourished, well developed in no acute distress NECK: No JVD; No carotid bruits CARDIAC: RRR, no murmurs, rubs, gallops RESPIRATORY:  Clear to auscultation without rales, wheezing or rhonchi  ABDOMEN: Soft, non-tender, non-distended EXTREMITIES:  No edema; No deformity   ASSESSMENT AND PLAN: .    HTN: Michela presents for further evaluation of her hypertension.  She still eats a fair amount of salt.  I have advised her to work on reducing the salt in her diet.  I encouraged her to exercise on a regular basis.  Will start her on hydrochlorothiazide 25 mg a day and potassium chloride 10 meq  a day.  Continue losartan 50 mg a day,  Bystolic 10 mg a day.  Will see her back in  the office in 3 months        Dispo: 3 months    Signed, Kristeen Miss, MD

## 2023-05-05 ENCOUNTER — Telehealth: Payer: Self-pay | Admitting: Cardiovascular Disease

## 2023-05-05 ENCOUNTER — Other Ambulatory Visit: Payer: Self-pay

## 2023-05-05 DIAGNOSIS — I10 Essential (primary) hypertension: Secondary | ICD-10-CM

## 2023-05-05 DIAGNOSIS — Z79899 Other long term (current) drug therapy: Secondary | ICD-10-CM

## 2023-05-05 NOTE — Telephone Encounter (Signed)
Lab released and pt able to have her labs.

## 2023-05-05 NOTE — Telephone Encounter (Signed)
Pt is at [facility] in [place] right now please fax lab order ASAP to [phone]

## 2023-05-06 ENCOUNTER — Encounter: Payer: Self-pay | Admitting: Cardiovascular Disease

## 2023-05-06 ENCOUNTER — Ambulatory Visit: Payer: BC Managed Care – PPO | Attending: Cardiovascular Disease | Admitting: Cardiovascular Disease

## 2023-05-06 VITALS — BP 145/80 | HR 70 | Ht 64.0 in | Wt 170.6 lb

## 2023-05-06 DIAGNOSIS — Z79899 Other long term (current) drug therapy: Secondary | ICD-10-CM

## 2023-05-06 DIAGNOSIS — I10 Essential (primary) hypertension: Secondary | ICD-10-CM

## 2023-05-06 LAB — BASIC METABOLIC PANEL
BUN/Creatinine Ratio: 21 (ref 12–28)
BUN: 16 mg/dL (ref 8–27)
CO2: 22 mmol/L (ref 20–29)
Calcium: 9.7 mg/dL (ref 8.7–10.3)
Chloride: 100 mmol/L (ref 96–106)
Creatinine, Ser: 0.75 mg/dL (ref 0.57–1.00)
Glucose: 130 mg/dL — ABNORMAL HIGH (ref 70–99)
Potassium: 4.3 mmol/L (ref 3.5–5.2)
Sodium: 139 mmol/L (ref 134–144)
eGFR: 89 mL/min/{1.73_m2} (ref >59)

## 2023-05-06 MED ORDER — HYDROCHLOROTHIAZIDE 25 MG PO TABS: 25.0000 mg | ORAL_TABLET | Freq: Every day | ORAL | 3 refills | Status: DC

## 2023-05-06 MED ORDER — POTASSIUM CHLORIDE ER 10 MEQ PO TBCR: 10.0000 meq | EXTENDED_RELEASE_TABLET | Freq: Every day | ORAL | 3 refills | Status: DC

## 2023-05-06 NOTE — Patient Instructions (Signed)
Medication Instructions:  STOP Amlodipine START Hydrochlorothiazide START Potassium Chloride daily *If you need a refill on your cardiac medications before your next appointment, please call your pharmacy*  Lab Work: BMET in 3 weeks If you have labs (blood work) drawn today and your tests are completely normal, you will receive your results only by: MyChart Message (if you have MyChart) OR A paper copy in the mail If you have any lab test that is abnormal or we need to change your treatment, we will call you to review the results.  Follow-Up: At [facility], you and your health needs are our priority.  As part of our continuing mission to provide you with exceptional heart care, we have created designated Provider Care Teams.  These Care Teams include your primary Cardiologist (physician) and Advanced Practice Providers (APPs -  Physician Assistants and Nurse Practitioners) who all work together to provide you with the care you need, when you need it.  Your next appointment:   3 month(s)  Provider:   Kristeen Miss, MD

## 2023-05-16 ENCOUNTER — Other Ambulatory Visit: Payer: Self-pay | Admitting: Family Medicine

## 2023-05-16 ENCOUNTER — Encounter: Payer: Self-pay | Admitting: Family Medicine

## 2023-05-16 MED ORDER — METFORMIN HCL ER 500 MG PO TB24: 500.0000 mg | ORAL_TABLET | Freq: Two times a day (BID) | ORAL | 0 refills | Status: DC

## 2023-05-28 LAB — BASIC METABOLIC PANEL
BUN/Creatinine Ratio: 23 (ref 12–28)
BUN: 17 mg/dL (ref 8–27)
CO2: 21 mmol/L (ref 20–29)
Calcium: 9.8 mg/dL (ref 8.7–10.3)
Chloride: 99 mmol/L (ref 96–106)
Creatinine, Ser: 0.75 mg/dL (ref 0.57–1.00)
Glucose: 122 mg/dL — ABNORMAL HIGH (ref 70–99)
Potassium: 4.2 mmol/L (ref 3.5–5.2)
Sodium: 138 mmol/L (ref 134–144)
eGFR: 89 mL/min/{1.73_m2} (ref >59)

## 2023-06-02 NOTE — Progress Notes (Deleted)
Subjective:  Patient ID: Claudia Shelton, female    DOB: 1960-10-04  Age: 62 y.o. MRN: [id number]  No chief complaint on file.   HPI Well Adult Physical: Patient here for a comprehensive physical exam.The patient reports {problems:16946} Do you take any herbs or supplements that were not prescribed by a doctor? {yes/no/not asked:9010} Are you taking calcium supplements? {yes/no:63} Are you taking aspirin daily? {yes/no:63}  Encounter for general adult medical examination without abnormal findings  Physical ("At Risk" items are starred): Patient's last physical exam was 1 year ago .  Patient is not afflicted from Stress Incontinence and Urge Incontinence  Patient wears a seat belts Patient has smoke detectors and has carbon monoxide detectors. Patient practices appropriate gun safety. Patient wears sunscreen with extended sun exposure. Dental Care: biannual cleanings, brushes and flosses daily. Ophthalmology/Optometry: Annual visit.  Hearing loss: none Vision impairments: none  Menarche: *** Menstrual History: *** LMP: *** Pregnancy history: *** Safe at home: *** Self breast exams: ***     11/11/2022    9:16 AM 09/02/2021    3:10 PM  Depression screen PHQ 2/9  Decreased Interest 0 0  Down, Depressed, Hopeless 0 0  PHQ - 2 Score 0 0  Altered sleeping 0   Tired, decreased energy 0   Change in appetite 0   Feeling bad or failure about yourself  0   Trouble concentrating 0   Moving slowly or fidgety/restless 0   Suicidal thoughts 0   PHQ-9 Score 0   Difficult doing work/chores Not difficult at all          09/02/2021    3:10 PM 11/11/2022    9:15 AM  Fall Risk  Falls in the past year? 0 0  Was there an injury with Fall? 1 0  Fall Risk Category Calculator 1 0  Fall Risk Category (Retired) Low   (RETIRED) Patient Fall Risk Level Low fall risk   Patient at Risk for Falls Due to  No Fall Risks  Fall risk Follow up  Education provided             Social Hx   Social  History   Socioeconomic History   Marital status: Married    Spouse name: Not on file   Number of children: Not on file   Years of education: Not on file   Highest education level: Not on file  Occupational History   Not on file  Tobacco Use   Smoking status: Never   Smokeless tobacco: Never  Vaping Use   Vaping status: Never Used  Substance and Sexual Activity   Alcohol use: No    Alcohol/week: 0.0 standard drinks of alcohol   Drug use: No   Sexual activity: Yes    Birth control/protection: None, Surgical  Other Topics Concern   Not on file  Social History Narrative   Not on file   Social Drivers of Health   Financial Resource Strain: Not on file  Food Insecurity: Not on file  Transportation Needs: Not on file  Physical Activity: Not on file  Stress: Not on file  Social Connections: Not on file   Past Medical History:  Diagnosis Date   Atrial fibrillation (HCC)    BMI 28.0-28.9,adult    Chest pain on exertion 2015   Heart murmur    Hyperlipidemia    Hypertension    Hypothyroidism    half of thyroid removed d/t large goiter   Impaired fasting glucose    Osteoporosis  Palpitations    Pleuritic pain    right lung base   PONV (postoperative nausea and vomiting)    SOB (shortness of breath) on exertion 2015   TMJ (dislocation of temporomandibular joint)    "not as bad as it was; very minor now" (02/11/2016)   Type II diabetes mellitus (HCC)    "they say I'm prediabetic but I'm on RX" (02/11/2016)   Past Surgical History:  Procedure Laterality Date   ABDOMINAL HYSTERECTOMY  11/27/2004   Laparoscopic supracervical hysterectomy, not due to cancer   BACK SURGERY     CERVICAL DISCECTOMY  2000s   CESAREAN SECTION  1993; 1998   COLONOSCOPY     ELECTROPHYSIOLOGIC STUDY N/A 02/11/2016   Procedure: A-Fluttter Ablation;  Surgeon: Marinus Maw, MD;  Location: [facility] INVASIVE CV LAB;  Service: Cardiovascular;  Laterality: N/A;   LEFT HEART CATHETERIZATION WITH CORONARY  ANGIOGRAM N/A 06/12/2014   Procedure: LEFT HEART CATHETERIZATION WITH CORONARY ANGIOGRAM;  Surgeon: Lesleigh Noe, MD;  Location: [facility] CATH LAB;  Service: Cardiovascular;  Laterality: N/A;   THYROIDECTOMY, PARTIAL Left    "pre-cancerous cells"    Family History  Problem Relation Age of Onset   Diabetes Mellitus I Mother    Hypertension Father    Hyperlipidemia Father    Colon polyps Father    Heart attack Paternal Grandfather 26   Stroke Paternal Aunt 67   Heart attack Paternal Uncle    Heart attack Cousin 90   Colon cancer Neg Hx    Rectal cancer Neg Hx    Esophageal cancer Neg Hx    Stomach cancer Neg Hx     Review of Systems   Objective:  There were no vitals taken for this visit.     05/06/2023    3:31 PM 05/06/2023    3:06 PM 03/23/2023    1:04 PM  BP/Weight  Systolic BP 145 156 140  Diastolic BP 80 84 70  Wt. (Lbs)  170.6 170.6  BMI  29.28 kg/m2 29.28 kg/m2    Physical Exam  Lab Results  Component Value Date   WBC 5.9 01/30/2016   HGB 14.6 01/30/2016   HCT 42.9 01/30/2016   PLT 242 01/30/2016   GLUCOSE 122 (H) 05/27/2023   NA 138 05/27/2023   K 4.2 05/27/2023   CL 99 05/27/2023   CREATININE 0.75 05/27/2023   BUN 17 05/27/2023   CO2 21 05/27/2023   TSH 0.17 (L) 04/20/2023   INR 0.93 06/12/2014      Assessment & Plan:  There are no diagnoses linked to this encounter.   There is no height or weight on file to calculate BMI.   These are the goals we discussed:  Goals   None      This is a list of the screening recommended for you and due dates:  Health Maintenance  Topic Date Due   COVID-19 Vaccine (1) Never done   HIV Screening  Never done   Hepatitis C Screening  Never done   DTaP/Tdap/Td vaccine (1 - Tdap) Never done   Zoster (Shingles) Vaccine (1 of 2) Never done   Pap with HPV screening  Never done   Colon Cancer Screening  11/12/2022   Flu Shot  Never done   Mammogram  07/16/2024   DEXA scan (bone density measurement)   07/16/2025   HPV Vaccine  Aged Out     No orders of the defined types were placed in this encounter.   Follow-up: No  follow-ups on file.  An After Visit Summary was printed and given to the patient.  Blane Ohara, MD [facility] [phone]

## 2023-06-03 ENCOUNTER — Encounter: Payer: BC Managed Care – PPO | Admitting: Family Medicine

## 2023-06-08 ENCOUNTER — Other Ambulatory Visit: Payer: Self-pay | Admitting: Family Medicine

## 2023-06-18 ENCOUNTER — Other Ambulatory Visit: Payer: Self-pay | Admitting: "Endocrinology

## 2023-06-22 ENCOUNTER — Encounter: Payer: Self-pay | Admitting: Family Medicine

## 2023-06-22 ENCOUNTER — Other Ambulatory Visit: Payer: Self-pay

## 2023-06-22 MED ORDER — METFORMIN HCL ER 500 MG PO TB24: 500.0000 mg | ORAL_TABLET | Freq: Two times a day (BID) | ORAL | 1 refills | Status: DC

## 2023-07-10 ENCOUNTER — Encounter: Payer: Self-pay | Admitting: Gastroenterology

## 2023-07-20 ENCOUNTER — Encounter: Payer: 59 | Admitting: Family Medicine

## 2023-07-20 NOTE — Progress Notes (Signed)
 Subjective:  Patient ID: Claudia Shelton, female    DOB: 1961-05-27  Age: 63 y.o. MRN: [id number]  Chief Complaint  Patient presents with   Annual Exam    HPI Well Adult Physical: Patient here for a comprehensive physical exam.The patient reports no problems Do you take any herbs or supplements that were not prescribed by a doctor? no Are you taking calcium  supplements? yes Are you taking aspirin  daily? no  Encounter for general adult medical examination without abnormal findings  Physical (At Risk items are starred): Patient's last physical exam was 1 year ago .  Patient is not afflicted from Stress Incontinence and Urge Incontinence  Patient wears a seat belts Patient has smoke detectors and has carbon monoxide detectors. Patient practices appropriate gun safety. Patient wears sunscreen with extended sun exposure. Dental Care: biannual cleanings, brushes and flosses daily. Ophthalmology/Optometry: Annual visit.  Hearing loss: none Vision impairments: contacts  Patient has Pap smears done by [facility] Safe at home: Yes Self breast exams: Yes     07/21/2023    1:28 PM 11/11/2022    9:16 AM 09/02/2021    3:10 PM  Depression screen PHQ 2/9  Decreased Interest 0 0 0  Down, Depressed, Hopeless 0 0 0  PHQ - 2 Score 0 0 0  Altered sleeping 0 0   Tired, decreased energy 0 0   Change in appetite 0 0   Feeling bad or failure about yourself  0 0   Trouble concentrating 0 0   Moving slowly or fidgety/restless 0 0   Suicidal thoughts 0 0   PHQ-9 Score 0 0   Difficult doing work/chores Not difficult at all Not difficult at all          09/02/2021    3:10 PM 11/11/2022    9:15 AM 07/21/2023    1:28 PM  Fall Risk  Falls in the past year? 0 0 0  Was there an injury with Fall? 1 0 0  Fall Risk Category Calculator 1 0 0  Fall Risk Category (Retired) Low    (RETIRED) Patient Fall Risk Level Low fall risk    Patient at Risk for Falls Due to  No Fall Risks No Fall Risks  Fall risk  Follow up  Education provided Falls evaluation completed             Social Hx   Social History   Socioeconomic History   Marital status: Married    Spouse name: Not on file   Number of children: Not on file   Years of education: Not on file   Highest education level: Not on file  Occupational History   Not on file  Tobacco Use   Smoking status: Never   Smokeless tobacco: Never  Vaping Use   Vaping status: Never Used  Substance and Sexual Activity   Alcohol use: No    Alcohol/week: 0.0 standard drinks of alcohol   Drug use: No   Sexual activity: Yes    Birth control/protection: None, Surgical  Other Topics Concern   Not on file  Social History Narrative   Not on file   Social Drivers of Health   Financial Resource Strain: Low Risk  (07/21/2023)   Overall Financial Resource Strain (CARDIA)    Difficulty of Paying Living Expenses: Not hard at all  Food Insecurity: No Food Insecurity (07/21/2023)   Hunger Vital Sign    Worried About Running Out of Food in the Last Year: Never true  Ran Out of Food in the Last Year: Never true  Transportation Needs: No Transportation Needs (07/21/2023)   PRAPARE - Administrator, Civil Service (Medical): No    Lack of Transportation (Non-Medical): No  Physical Activity: Inactive (07/21/2023)   Exercise Vital Sign    Days of Exercise per Week: 0 days    Minutes of Exercise per Session: 0 min  Stress: No Stress Concern Present (07/21/2023)   [employer] of Occupational Health - Occupational Stress Questionnaire    Feeling of Stress : Not at all  Social Connections: Moderately Integrated (07/21/2023)   Social Connection and Isolation Panel [NHANES]    Frequency of Communication with Friends and Family: More than three times a week    Frequency of Social Gatherings with Friends and Family: More than three times a week    Attends Religious Services: More than 4 times per year    Active Member of Clubs or Organizations: No     Attends Banker Meetings: Never    Marital Status: Married   Past Medical History:  Diagnosis Date   Atrial fibrillation (HCC)    BMI 28.0-28.9,adult    Chest pain on exertion 2015   Heart murmur    Hyperlipidemia    Hypertension    Hypothyroidism    half of thyroid  removed d/t large goiter   Impaired fasting glucose    Osteoporosis    Palpitations    Pleuritic pain    right lung base   PONV (postoperative nausea and vomiting)    SOB (shortness of breath) on exertion 2015   TMJ (dislocation of temporomandibular joint)    not as bad as it was; very minor now (02/11/2016)   Type II diabetes mellitus (HCC)    they say I'm prediabetic but I'm on RX (02/11/2016)   Past Surgical History:  Procedure Laterality Date   ABDOMINAL HYSTERECTOMY  11/27/2004   Laparoscopic supracervical hysterectomy, not due to cancer   BACK SURGERY     CERVICAL DISCECTOMY  2000s   CESAREAN SECTION  1993; 1998   COLONOSCOPY     ELECTROPHYSIOLOGIC STUDY N/A 02/11/2016   Procedure: A-Fluttter Ablation;  Surgeon: Danelle LELON Birmingham, MD;  Location: [facility];  Service: Cardiovascular;  Laterality: N/A;   LEFT HEART CATHETERIZATION WITH CORONARY ANGIOGRAM N/A 06/12/2014   Procedure: LEFT HEART CATHETERIZATION WITH CORONARY ANGIOGRAM;  Surgeon: Victory LELON Claudene DOUGLAS, MD;  Location: [facility] CATH LAB;  Service: Cardiovascular;  Laterality: N/A;   THYROIDECTOMY, PARTIAL Left    pre-cancerous cells    Family History  Problem Relation Age of Onset   Diabetes Mellitus I Mother    Hypertension Father    Hyperlipidemia Father    Colon polyps Father    Heart attack Paternal Grandfather 29   Stroke Paternal Aunt 66   Heart attack Paternal Uncle    Heart attack Cousin 72   Colon cancer Neg Hx    Rectal cancer Neg Hx    Esophageal cancer Neg Hx    Stomach cancer Neg Hx     Review of Systems  Constitutional:  Negative for chills, fatigue and fever.  HENT:  Negative for congestion, ear pain,  rhinorrhea and sore throat.   Respiratory:  Negative for cough and shortness of breath.   Cardiovascular:  Negative for chest pain.  Gastrointestinal:  Negative for abdominal pain, constipation, diarrhea, nausea and vomiting.  Genitourinary:  Negative for dysuria and urgency.  Musculoskeletal:  Negative for back pain and  myalgias.  Neurological:  Negative for dizziness, weakness, light-headedness and headaches.  Psychiatric/Behavioral:  Negative for dysphoric mood. The patient is not nervous/anxious.      Objective:  BP 132/72 (BP Location: Left Arm, Patient Position: Sitting)   Pulse 78   Temp (!) 97.3 F (36.3 C) (Temporal)   Ht 5' 4 (1.626 m)   Wt 172 lb (78 kg)   SpO2 98%   BMI 29.52 kg/m      07/21/2023    1:25 PM 05/06/2023    3:31 PM 05/06/2023    3:06 PM  BP/Weight  Systolic BP 132 145 156  Diastolic BP 72 80 84  Wt. (Lbs) 172  170.6  BMI 29.52 kg/m2  29.28 kg/m2    Physical Exam Vitals reviewed.  Constitutional:      Appearance: Normal appearance. She is normal weight.  HENT:     Right Ear: Tympanic membrane normal.     Left Ear: Tympanic membrane normal.     Nose: Nose normal.     Mouth/Throat:     Pharynx: No oropharyngeal exudate or posterior oropharyngeal erythema.  Neck:     Vascular: No carotid bruit.  Cardiovascular:     Rate and Rhythm: Normal rate and regular rhythm.     Heart sounds: Normal heart sounds.  Pulmonary:     Effort: Pulmonary effort is normal. No respiratory distress.     Breath sounds: Normal breath sounds.  Abdominal:     General: Abdomen is flat. Bowel sounds are normal.     Palpations: Abdomen is soft.     Tenderness: There is no abdominal tenderness.  Skin:    General: Skin is warm.  Neurological:     Mental Status: She is alert and oriented to person, place, and time.  Psychiatric:        Mood and Affect: Mood normal.        Behavior: Behavior normal.     Lab Results  Component Value Date   WBC 7.9 07/21/2023    HGB 15.2 07/21/2023   HCT 45.9 07/21/2023   PLT 303 07/21/2023   GLUCOSE 152 (H) 07/21/2023   CHOL 159 07/21/2023   TRIG 268 (H) 07/21/2023   HDL 51 07/21/2023   LDLCALC 65 07/21/2023   ALT 20 07/21/2023   AST 19 07/21/2023   NA 138 07/21/2023   K 4.4 07/21/2023   CL 94 (L) 07/21/2023   CREATININE 1.07 (H) 07/21/2023   BUN 23 07/21/2023   CO2 25 07/21/2023   TSH 0.076 (L) 07/21/2023   INR 0.93 06/12/2014   HGBA1C 6.5 (H) 07/21/2023      Assessment & Plan:  Routine medical exam Assessment & Plan: Things to do to keep yourself healthy  - Exercise at least 30-45 minutes a day, 3-4 days a week.  - Eat a low-fat diet with lots of fruits and vegetables, up to 7-9 servings per day.  - Seatbelts can save your life. Wear them always.  - Smoke detectors on every level of your home, check batteries every year.  - Eye Doctor - have an eye exam every 1-2 years  - Safe sex - if you may be exposed to STDs, use a condom.  - Alcohol -  If you drink, do it moderately, less than 2 drinks per day.  - Health Care Power of Attorney. Choose someone to speak for you if you are not able.  - Depression is common in our stressful world.If you're feeling down or losing interest  in things you normally enjoy, please come in for a visit.  - Violence - If anyone is threatening or hurting you, please call immediately.   Orders: -     CBC with Differential/Platelet -     Comprehensive metabolic panel -     Lipid panel -     Tdap vaccine greater than or equal to 7yo IM  Encounter for screening for HIV Assessment & Plan: Check hiv.   Orders: -     HIV Antibody (routine testing w rflx)  Need for hepatitis C screening test Assessment & Plan: Check hep c screen  Orders: -     HCV Ab w Reflex to Quant PCR  Acquired hypothyroidism Assessment & Plan: Check labs and send to Dr. Dartha  Orders: -     T4, free -     TSH -     T3, free  Elevated glucose Assessment & Plan: Check  A1C.  Orders: -     Hemoglobin A1c  Visit for screening mammogram -     Ambulatory referral to Gastroenterology  Need for vaccination  Other orders -     Interpretation:     Body mass index is 29.52 kg/m.    This is a list of the screening recommended for you and due dates:  Health Maintenance  Topic Date Due   Zoster (Shingles) Vaccine (1 of 2) Never done   Colon Cancer Screening  11/12/2022   COVID-19 Vaccine (4 - 2024-25 season) 02/13/2023   Flu Shot  09/12/2023*   Mammogram  07/16/2024   DEXA scan (bone density measurement)  07/16/2025   Pap with HPV screening  11/13/2025   DTaP/Tdap/Td vaccine (6 - Td or Tdap) 07/20/2033   Hepatitis C Screening  Completed   HIV Screening  Completed   HPV Vaccine  Aged Out  *Topic was postponed. The date shown is not the original due date.     No orders of the defined types were placed in this encounter.   Follow-up: Return in about 1 year (around 07/20/2024) for cpe fasting.  An After Visit Summary was printed and given to the patient.  I,Lauren M Auman,acting as a scribe for Abigail Free, MD.,have documented all relevant documentation on the behalf of Abigail Free, MD,as directed by  Abigail Free, MD while in the presence of Abigail Free, MD.   I attest that I have reviewed this visit and agree with the plan scribed by my staff.   Abigail Free, MD [facility] [phone]

## 2023-07-20 NOTE — Progress Notes (Deleted)
 Subjective:  Patient ID: Claudia Shelton, female    DOB: Aug 04, 1960  Age: 63 y.o. MRN: [id number]  No chief complaint on file.   HPI Well Adult Physical: Patient here for a comprehensive physical exam.The patient reports {problems:16946} Do you take any herbs or supplements that were not prescribed by a doctor? {yes/no/not asked:9010} Are you taking calcium  supplements? {yes/no:63} Are you taking aspirin  daily? {yes/no:63}  Encounter for general adult medical examination without abnormal findings  Physical (At Risk items are starred): Patient's last physical exam was 1 year ago .  Patient is not afflicted from Stress Incontinence and Urge Incontinence  Patient wears a seat belts Patient has smoke detectors and has carbon monoxide detectors. Patient practices appropriate gun safety. Patient wears sunscreen with extended sun exposure. Dental Care: biannual cleanings, brushes and flosses daily. Ophthalmology/Optometry: Annual visit.  Hearing loss: none Vision impairments: none  Menarche: *** Menstrual History: *** LMP: *** Pregnancy history: *** Safe at home: *** Self breast exams: ***     11/11/2022    9:16 AM 09/02/2021    3:10 PM  Depression screen PHQ 2/9  Decreased Interest 0 0  Down, Depressed, Hopeless 0 0  PHQ - 2 Score 0 0  Altered sleeping 0   Tired, decreased energy 0   Change in appetite 0   Feeling bad or failure about yourself  0   Trouble concentrating 0   Moving slowly or fidgety/restless 0   Suicidal thoughts 0   PHQ-9 Score 0   Difficult doing work/chores Not difficult at all          09/02/2021    3:10 PM 11/11/2022    9:15 AM  Fall Risk  Falls in the past year? 0 0  Was there an injury with Fall? 1 0  Fall Risk Category Calculator 1 0  Fall Risk Category (Retired) Low   (RETIRED) Patient Fall Risk Level Low fall risk   Patient at Risk for Falls Due to  No Fall Risks  Fall risk Follow up  Education provided             Social Hx   Social  History   Socioeconomic History   Marital status: Married    Spouse name: Not on file   Number of children: Not on file   Years of education: Not on file   Highest education level: Not on file  Occupational History   Not on file  Tobacco Use   Smoking status: Never   Smokeless tobacco: Never  Vaping Use   Vaping status: Never Used  Substance and Sexual Activity   Alcohol use: No    Alcohol/week: 0.0 standard drinks of alcohol   Drug use: No   Sexual activity: Yes    Birth control/protection: None, Surgical  Other Topics Concern   Not on file  Social History Narrative   Not on file   Social Drivers of Health   Financial Resource Strain: Not on file  Food Insecurity: Not on file  Transportation Needs: Not on file  Physical Activity: Not on file  Stress: Not on file  Social Connections: Not on file   Past Medical History:  Diagnosis Date   Atrial fibrillation (HCC)    BMI 28.0-28.9,adult    Chest pain on exertion 2015   Heart murmur    Hyperlipidemia    Hypertension    Hypothyroidism    half of thyroid  removed d/t large goiter   Impaired fasting glucose    Osteoporosis  Palpitations    Pleuritic pain    right lung base   PONV (postoperative nausea and vomiting)    SOB (shortness of breath) on exertion 2015   TMJ (dislocation of temporomandibular joint)    not as bad as it was; very minor now (02/11/2016)   Type II diabetes mellitus (HCC)    they say I'm prediabetic but I'm on RX (02/11/2016)   Past Surgical History:  Procedure Laterality Date   ABDOMINAL HYSTERECTOMY  11/27/2004   Laparoscopic supracervical hysterectomy, not due to cancer   BACK SURGERY     CERVICAL DISCECTOMY  2000s   CESAREAN SECTION  1993; 1998   COLONOSCOPY     ELECTROPHYSIOLOGIC STUDY N/A 02/11/2016   Procedure: A-Fluttter Ablation;  Surgeon: Danelle LELON Birmingham, MD;  Location: [facility];  Service: Cardiovascular;  Laterality: N/A;   LEFT HEART CATHETERIZATION WITH CORONARY  ANGIOGRAM N/A 06/12/2014   Procedure: LEFT HEART CATHETERIZATION WITH CORONARY ANGIOGRAM;  Surgeon: Victory LELON Claudene DOUGLAS, MD;  Location: [facility] CATH LAB;  Service: Cardiovascular;  Laterality: N/A;   THYROIDECTOMY, PARTIAL Left    pre-cancerous cells    Family History  Problem Relation Age of Onset   Diabetes Mellitus I Mother    Hypertension Father    Hyperlipidemia Father    Colon polyps Father    Heart attack Paternal Grandfather 25   Stroke Paternal Aunt 36   Heart attack Paternal Uncle    Heart attack Cousin 63   Colon cancer Neg Hx    Rectal cancer Neg Hx    Esophageal cancer Neg Hx    Stomach cancer Neg Hx     Review of Systems   Objective:  There were no vitals taken for this visit.     05/06/2023    3:31 PM 05/06/2023    3:06 PM 03/23/2023    1:04 PM  BP/Weight  Systolic BP 145 156 140  Diastolic BP 80 84 70  Wt. (Lbs)  170.6 170.6  BMI  29.28 kg/m2 29.28 kg/m2    Physical Exam  Lab Results  Component Value Date   WBC 5.9 01/30/2016   HGB 14.6 01/30/2016   HCT 42.9 01/30/2016   PLT 242 01/30/2016   GLUCOSE 122 (H) 05/27/2023   NA 138 05/27/2023   K 4.2 05/27/2023   CL 99 05/27/2023   CREATININE 0.75 05/27/2023   BUN 17 05/27/2023   CO2 21 05/27/2023   TSH 0.17 (L) 04/20/2023   INR 0.93 06/12/2014      Assessment & Plan:  There are no diagnoses linked to this encounter.   There is no height or weight on file to calculate BMI.   These are the goals we discussed:  Goals   None      This is a list of the screening recommended for you and due dates:  Health Maintenance  Topic Date Due   HIV Screening  Never done   Hepatitis C Screening  Never done   Zoster (Shingles) Vaccine (1 of 2) Never done   Pap with HPV screening  Never done   DTaP/Tdap/Td vaccine (5 - Td or Tdap) 02/15/2021   Colon Cancer Screening  11/12/2022   COVID-19 Vaccine (4 - 2024-25 season) 02/13/2023   Flu Shot  09/12/2023*   Mammogram  07/16/2024   DEXA scan (bone  density measurement)  07/16/2025   HPV Vaccine  Aged Out  *Topic was postponed. The date shown is not the original due date.  No orders of the defined types were placed in this encounter.   Follow-up: No follow-ups on file.  An After Visit Summary was printed and given to the patient.  Abigail Free, MD [facility] [phone]

## 2023-07-21 ENCOUNTER — Ambulatory Visit (INDEPENDENT_AMBULATORY_CARE_PROVIDER_SITE_OTHER): Payer: 59 | Admitting: Family Medicine

## 2023-07-21 ENCOUNTER — Encounter: Payer: Self-pay | Admitting: Family Medicine

## 2023-07-21 ENCOUNTER — Telehealth: Payer: Self-pay

## 2023-07-21 VITALS — BP 132/72 | HR 78 | Temp 97.3°F | Ht 64.0 in | Wt 172.0 lb

## 2023-07-21 DIAGNOSIS — E039 Hypothyroidism, unspecified: Secondary | ICD-10-CM

## 2023-07-21 DIAGNOSIS — Z0000 Encounter for general adult medical examination without abnormal findings: Secondary | ICD-10-CM | POA: Insufficient documentation

## 2023-07-21 DIAGNOSIS — Z114 Encounter for screening for human immunodeficiency virus [HIV]: Secondary | ICD-10-CM | POA: Diagnosis not present

## 2023-07-21 DIAGNOSIS — Z1231 Encounter for screening mammogram for malignant neoplasm of breast: Secondary | ICD-10-CM | POA: Insufficient documentation

## 2023-07-21 DIAGNOSIS — R7309 Other abnormal glucose: Secondary | ICD-10-CM | POA: Insufficient documentation

## 2023-07-21 DIAGNOSIS — Z23 Encounter for immunization: Secondary | ICD-10-CM | POA: Diagnosis not present

## 2023-07-21 DIAGNOSIS — Z1159 Encounter for screening for other viral diseases: Secondary | ICD-10-CM | POA: Insufficient documentation

## 2023-07-21 NOTE — Assessment & Plan Note (Signed)
Check hiv 

## 2023-07-21 NOTE — Assessment & Plan Note (Signed)

## 2023-07-21 NOTE — Assessment & Plan Note (Signed)
Check hep c screen

## 2023-07-21 NOTE — Assessment & Plan Note (Signed)
 Check labs and send to Dr. Vertell Gory

## 2023-07-21 NOTE — Patient Instructions (Signed)

## 2023-07-21 NOTE — Telephone Encounter (Signed)
Orders Placed This Encounter  Procedures   TSH + free T4

## 2023-07-21 NOTE — Assessment & Plan Note (Signed)
 Check A1C

## 2023-07-22 ENCOUNTER — Other Ambulatory Visit: Payer: BC Managed Care – PPO

## 2023-07-22 ENCOUNTER — Encounter: Payer: Self-pay | Admitting: Cardiovascular Disease

## 2023-07-22 LAB — CBC WITH DIFFERENTIAL/PLATELET
Basophils Absolute: 0.1 10*3/uL (ref 0.0–0.2)
Basos: 1 %
EOS (ABSOLUTE): 0.2 10*3/uL (ref 0.0–0.4)
Eos: 2 %
Hematocrit: 45.9 % (ref 34.0–46.6)
Hemoglobin: 15.2 g/dL (ref 11.1–15.9)
Immature Grans (Abs): 0 10*3/uL (ref 0.0–0.1)
Immature Granulocytes: 0 %
Lymphocytes Absolute: 1.6 10*3/uL (ref 0.7–3.1)
Lymphs: 21 %
MCH: 29.9 pg (ref 26.6–33.0)
MCHC: 33.1 g/dL (ref 31.5–35.7)
MCV: 90 fL (ref 79–97)
Monocytes Absolute: 0.6 10*3/uL (ref 0.1–0.9)
Monocytes: 8 %
Neutrophils Absolute: 5.5 10*3/uL (ref 1.4–7.0)
Neutrophils: 68 %
Platelets: 303 10*3/uL (ref 150–450)
RBC: 5.08 x10E6/uL (ref 3.77–5.28)
RDW: 12.4 % (ref 11.7–15.4)
WBC: 7.9 10*3/uL (ref 3.4–10.8)

## 2023-07-22 LAB — COMPREHENSIVE METABOLIC PANEL
ALT: 20 [IU]/L (ref 0–32)
AST: 19 [IU]/L (ref 0–40)
Albumin: 4.4 g/dL (ref 3.9–4.9)
Alkaline Phosphatase: 88 [IU]/L (ref 44–121)
BUN/Creatinine Ratio: 21 (ref 12–28)
BUN: 23 mg/dL (ref 8–27)
Bilirubin Total: 0.2 mg/dL (ref 0.0–1.2)
CO2: 25 mmol/L (ref 20–29)
Calcium: 9.9 mg/dL (ref 8.7–10.3)
Chloride: 94 mmol/L — ABNORMAL LOW (ref 96–106)
Creatinine, Ser: 1.07 mg/dL — ABNORMAL HIGH (ref 0.57–1.00)
Globulin, Total: 2.5 g/dL (ref 1.5–4.5)
Glucose: 152 mg/dL — ABNORMAL HIGH (ref 70–99)
Potassium: 4.4 mmol/L (ref 3.5–5.2)
Sodium: 138 mmol/L (ref 134–144)
Total Protein: 6.9 g/dL (ref 6.0–8.5)
eGFR: 58 mL/min/{1.73_m2} — ABNORMAL LOW (ref >59)

## 2023-07-22 LAB — LIPID PANEL
Chol/HDL Ratio: 3.1 {ratio} (ref 0.0–4.4)
Cholesterol, Total: 159 mg/dL (ref 100–199)
HDL: 51 mg/dL (ref >39)
LDL Chol Calc (NIH): 65 mg/dL (ref 0–99)
Triglycerides: 268 mg/dL — ABNORMAL HIGH (ref 0–149)
VLDL Cholesterol Cal: 43 mg/dL — ABNORMAL HIGH (ref 5–40)

## 2023-07-22 LAB — HCV INTERPRETATION

## 2023-07-22 LAB — HCV AB W REFLEX TO QUANT PCR: HCV Ab: NONREACTIVE

## 2023-07-22 LAB — TSH: TSH: 0.076 u[IU]/mL — ABNORMAL LOW (ref 0.450–4.500)

## 2023-07-22 LAB — T3, FREE: T3, Free: 3.1 pg/mL (ref 2.0–4.4)

## 2023-07-22 LAB — HEMOGLOBIN A1C
Est. average glucose Bld gHb Est-mCnc: 140 mg/dL
Hgb A1c MFr Bld: 6.5 % — ABNORMAL HIGH (ref 4.8–5.6)

## 2023-07-22 LAB — HIV ANTIBODY (ROUTINE TESTING W REFLEX): HIV Screen 4th Generation wRfx: NONREACTIVE

## 2023-07-22 LAB — T4, FREE: Free T4: 2.32 ng/dL — ABNORMAL HIGH (ref 0.82–1.77)

## 2023-07-23 NOTE — Progress Notes (Signed)
 This encounter was created in error - please disregard.

## 2023-07-24 ENCOUNTER — Encounter: Payer: Self-pay | Admitting: Family Medicine

## 2023-07-26 ENCOUNTER — Telehealth: Payer: Self-pay

## 2023-07-26 ENCOUNTER — Telehealth: Payer: Self-pay | Admitting: "Endocrinology

## 2023-07-26 NOTE — Telephone Encounter (Signed)
Patient called in and scheduled a appointment for Kidney function labs for two weeks from last Thursday. Patient also made a fasting lab appointment for 3 months for A1c check (or did she need to make appointment with you)?  Patient asked about you adding Lovaza, She was questioning if she should discontinue the cholesterol meds or just add this med to what she is already taking? Please advise.Marland KitchenMarland KitchenMarland Kitchen

## 2023-07-26 NOTE — Telephone Encounter (Signed)
I called patient to cancel appointment on Thursday 07/28/23 due to Dr Roosevelt Locks being out. Patient asked if there was anyone else she can see for that day because she is having concerns with her TSH an T4 levels. Please advise if she can be seen by someone else

## 2023-07-27 NOTE — Telephone Encounter (Signed)
Can she come tomorrow at 3:40? Ty! C

## 2023-07-27 NOTE — Telephone Encounter (Signed)
I called patient back and offered the 11am but she said she is only able to do afternoons, She asked if she can do any other day this week or next week in the afternoon

## 2023-07-28 ENCOUNTER — Ambulatory Visit: Payer: BC Managed Care – PPO | Admitting: "Endocrinology

## 2023-07-28 ENCOUNTER — Encounter: Payer: Self-pay | Admitting: Internal Medicine

## 2023-07-28 ENCOUNTER — Ambulatory Visit: Payer: BC Managed Care – PPO | Admitting: Internal Medicine

## 2023-07-28 ENCOUNTER — Other Ambulatory Visit: Payer: Self-pay

## 2023-07-28 VITALS — BP 138/80 | HR 64 | Ht 64.0 in | Wt 171.4 lb

## 2023-07-28 DIAGNOSIS — E890 Postprocedural hypothyroidism: Secondary | ICD-10-CM

## 2023-07-28 DIAGNOSIS — N289 Disorder of kidney and ureter, unspecified: Secondary | ICD-10-CM

## 2023-07-28 DIAGNOSIS — E119 Type 2 diabetes mellitus without complications: Secondary | ICD-10-CM

## 2023-07-28 DIAGNOSIS — I10 Essential (primary) hypertension: Secondary | ICD-10-CM

## 2023-07-28 MED ORDER — LEVOTHYROXINE SODIUM 137 MCG PO TABS: 137.0000 ug | ORAL_TABLET | Freq: Every day | ORAL | 3 refills | Status: DC

## 2023-07-28 MED ORDER — OMEGA-3-ACID ETHYL ESTERS 1 G PO CAPS: 2.0000 g | ORAL_CAPSULE | Freq: Two times a day (BID) | ORAL | 0 refills | Status: DC

## 2023-07-28 NOTE — Telephone Encounter (Signed)
Called patient, Appointment made with provider in 3 months, labs ordered

## 2023-07-28 NOTE — Patient Instructions (Addendum)
Please decrease Levothyroxine to 137 mcg daily.  Take the thyroid hormone every day, with water, at least 30 minutes before breakfast, separated by at least 4 hours from: - acid reflux medications - calcium - iron - multivitamins  Please come back for labs in 5 weeks, but in 2-3 months for another appt. With Dr. Roosevelt Locks.

## 2023-07-28 NOTE — Progress Notes (Signed)
Patient ID: Claudia Shelton, female   DOB: April 07, 1961, 63 y.o.   MRN: [id number]  HPI  Claudia Shelton is a 63 y.o.-year-old female, returns to the clinic for management of postsurgical hypothyroidism.  She is usually seen Dr. Roosevelt Locks in the practice-last visit 3 months ago, but she is out at the moment and patient wanted an appointment due to abnormal thyroid tests recently.  Pt. has been dx with thyroid disease at the beginning of the 2000s.  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    In  2005, she had left thyroidectomy for goiter [facility]).  She reportedly had " precancerous cells" in the resected lobe.  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  She was initially on 120 mg daily, then 90 then 60 mg daily, however, she then had to switch to levothyroxine again due to price and palpitations/tachyarrhythmia - 03/2023.   She recently had thyroid tests checked by PCP and they were in the thyrotoxic range.  At today's visit, she mentions that she is feeling poorly with fatigue, palpitations occasionally associated with shortness of breath and chest discomfort (but not pain or pressure).  Also, she describes not being able to sleep, some anxiety, tremors, hot flashes, and diarrhea alternating with constipation, itching.  She mentions that she did not have this symptom, she was on desiccated thyroid in the past and she is interested to restart this.  Pt. is on levothyroxine 175 mcg 6/7 days and 87.5 mcg 1/7 days (equivalent of 162.5 mcg daily), taken: - in am - fasting - at least 30 min from b'fast (1-2 usually) - 1h later: Nebivolol, Metformin, Vit C. - + calcium and vitamin D in the evening - no iron - + multivitamins in the evening - no PPIs - not  on Biotin  I reviewed pt's thyroid tests: Lab Results  Component Value Date   TSH 0.076 (L) 07/21/2023   TSH 0.17 (L) 04/20/2023   TSH 2.04 03/16/2023   TSH 5.20 (H) 01/13/2016   FREET4 2.32 (H) 07/21/2023   FREET4 1.68 (H) 04/20/2023   FREET4 0.69 03/16/2023   T3FREE 3.1 07/21/2023   T3FREE 3.6 04/20/2023   T3FREE 4.3 (H) 03/23/2023   Antithyroid antibodies were positive in the chart per review of 2015 ultrasound report, but no records available. No results found for: "THGAB" No components found for: "TPOAB"  Review latest thyroid ultrasound report (12/19/2019):   Pt denies: - feeling nodules in neck - choking  But does have: - hoarseness - dysphagia Possibly associated with reflux.  She has no FH of thyroid disorders. No FH of thyroid cancer. No h/o radiation tx to head or neck aside RAI tx. No recent use of iodine supplements.  No herbal supplements.  + recent steroid use - 2 mo ago: Kenalog.  Pt. also has a history of atrial fibrillation/flutter (had a cardiac ablation), heart murmur, HTN, HL, osteoporosis, overweight, history of back surgery. Also, she was recently diagnosed with diabetes and she was started on metformin by PCP.    ROS: + see HPI  Past Medical History:  Diagnosis Date   Atrial fibrillation (HCC)    BMI 28.0-28.9,adult    Chest pain on exertion 2015   Heart murmur  Hyperlipidemia    Hypertension    Hypothyroidism    half of thyroid removed d/t large goiter   Impaired fasting glucose    Osteoporosis    Palpitations    Pleuritic pain    right lung base   PONV (postoperative nausea and vomiting)    SOB (shortness of breath) on exertion 2015   TMJ (dislocation of temporomandibular joint)    "not as bad as it was; very minor now" (02/11/2016)   Type II diabetes mellitus (HCC)    "they say I'm prediabetic but I'm on RX" (02/11/2016)   Past Surgical History:  Procedure Laterality Date   ABDOMINAL HYSTERECTOMY  11/27/2004   Laparoscopic  supracervical hysterectomy, not due to cancer   BACK SURGERY     CERVICAL DISCECTOMY  2000s   CESAREAN SECTION  1993; 1998   COLONOSCOPY     ELECTROPHYSIOLOGIC STUDY N/A 02/11/2016   Procedure: A-Fluttter Ablation;  Surgeon: Marinus Maw, MD;  Location: [facility] INVASIVE [facility];  Service: Cardiovascular;  Laterality: N/A;   LEFT HEART CATHETERIZATION WITH CORONARY ANGIOGRAM N/A 06/12/2014   Procedure: LEFT HEART CATHETERIZATION WITH CORONARY ANGIOGRAM;  Surgeon: Lesleigh Noe, MD;  Location: [facility] CATH LAB;  Service: Cardiovascular;  Laterality: N/A;   THYROIDECTOMY, PARTIAL Left    "pre-cancerous cells"   Social History   Socioeconomic History   Marital status: Married    Spouse name: Not on file   Number of children: Not on file   Years of education: Not on file   Highest education level: Not on file  Occupational History   Not on file  Tobacco Use   Smoking status: Never   Smokeless tobacco: Never  Vaping Use   Vaping status: Never Used  Substance and Sexual Activity   Alcohol use: No    Alcohol/week: 0.0 standard drinks of alcohol   Drug use: No   Sexual activity: Yes    Birth control/protection: None, Surgical  Other Topics Concern   Not on file  Social History Narrative   Not on file   Social Drivers of Health   Financial Resource Strain: Low Risk  (07/21/2023)   Overall Financial Resource Strain (CARDIA)    Difficulty of Paying Living Expenses: Not hard at all  Food Insecurity: No Food Insecurity (07/21/2023)   Hunger Vital Sign    Worried About Running Out of Food in the Last Year: Never true    Ran Out of Food in the Last Year: Never true  Transportation Needs: No Transportation Needs (07/21/2023)   PRAPARE - Administrator, Civil Service (Medical): No    Lack of Transportation (Non-Medical): No  Physical Activity: Inactive (07/21/2023)   Exercise Vital Sign    Days of Exercise per Week: 0 days    Minutes of Exercise per Session: 0 min  Stress: No  Stress Concern Present (07/21/2023)   [employer] of Occupational Health - Occupational Stress Questionnaire    Feeling of Stress : Not at all  Social Connections: Moderately Integrated (07/21/2023)   Social Connection and Isolation Panel [NHANES]    Frequency of Communication with Friends and Family: More than three times a week    Frequency of Social Gatherings with Friends and Family: More than three times a week    Attends Religious Services: More than 4 times per year    Active Member of [employer] or Organizations: No    Attends Banker Meetings: Never    Marital Status: Married  Intimate  Partner Violence: Not At Risk (07/21/2023)   Humiliation, Afraid, Rape, and Kick questionnaire    Fear of Current or Ex-Partner: No    Emotionally Abused: No    Physically Abused: No    Sexually Abused: No   Current Outpatient Medications on File Prior to Visit  Medication Sig Dispense Refill   alendronate (FOSAMAX) 70 MG tablet Take 70 mg by mouth once a week.     calcium carbonate (OS-CAL - DOSED IN MG OF ELEMENTAL CALCIUM) 1250 (500 Ca) MG tablet Take 2 tablets by mouth daily with breakfast.     ezetimibe (ZETIA) 10 MG tablet Take 10 mg by mouth daily.     levothyroxine (SYNTHROID) 175 MCG tablet Take 1 tab every morning except on Sunday 90 tablet 1   losartan (COZAAR) 50 MG tablet Take 1 tablet (50 mg total) by mouth daily. 90 tablet 3   metFORMIN (GLUCOPHAGE-XR) 500 MG 24 hr tablet Take 1 tablet (500 mg total) by mouth 2 (two) times daily. 180 tablet 1   nebivolol (BYSTOLIC) 10 MG tablet Take 1 tablet (10 mg total) by mouth daily. 90 tablet 3   simvastatin (ZOCOR) 20 MG tablet Take 20 mg by mouth daily.     Vitamin D, Ergocalciferol, (DRISDOL) 50000 units CAPS capsule Take 50,000 Units by mouth once a week. Thursday  5   hydrochlorothiazide (HYDRODIURIL) 25 MG tablet Take 1 tablet (25 mg total) by mouth daily. (Patient not taking: Reported on 07/28/2023) 90 tablet 3   potassium  chloride (KLOR-CON) 10 MEQ tablet Take 1 tablet (10 mEq total) by mouth daily. (Patient not taking: Reported on 07/28/2023) 90 tablet 3   No current facility-administered medications on file prior to visit.   Allergies  Allergen Reactions   Adhesive [Tape] Hives and Itching   Almond Oil Itching and Swelling   Latex Hives   Family History  Problem Relation Age of Onset   Diabetes Mellitus I Mother    Hypertension Father    Hyperlipidemia Father    Colon polyps Father    Heart attack Paternal Grandfather 57   Stroke Paternal Aunt 46   Heart attack Paternal Uncle    Heart attack Cousin 46   Colon cancer Neg Hx    Rectal cancer Neg Hx    Esophageal cancer Neg Hx    Stomach cancer Neg Hx    PE: BP 138/80   Pulse 64   Ht 5\' 4"  (1.626 m)   Wt 171 lb 6.4 oz (77.7 kg)   SpO2 97%   BMI 29.42 kg/m  Wt Readings from Last 3 Encounters:  07/28/23 171 lb 6.4 oz (77.7 kg)  07/21/23 172 lb (78 kg)  05/06/23 170 lb 9.6 oz (77.4 kg)   Constitutional: overweight, in NAD Eyes:  EOMI, no exophthalmos ENT: no neck masses, no cervical lymphadenopathy Cardiovascular: RRR, No MRG Respiratory: CTA B Musculoskeletal: no deformities Skin:no rashes Neurological: no tremor with outstretched hands  ASSESSMENT: 1.  Postsurgical hypothyroidism  2. S/p near complete thyroidectomy  PLAN:  1. Patient with long-standing hypothyroidism, on levothyroxine therapy.  Per review of the chart, patient had a toxic adenoma, for which she received RAI treatment previously.  She also had a previous diagnosis of thyroiditis with positive antithyroid antibodies per review of the ultrasound report from 2015. - latest thyroid labs reviewed with pt. >> TSH was suppressed: Lab Results  Component Value Date   TSH 0.076 (L) 07/21/2023  - she continues on LT4 175 mcg 6/7 days and  87.5 mcg 1/7 days - pt feels poorly with thyrotoxic symptoms including tremors, palpitations/shortness of breath, anxiety, insomnia,  pruritus - At today's visit we discussed that the above symptoms are likely not related to the thyroid hormone formulation, but dose.  She is taking too much levothyroxine and we discussed about the deleterious effects of thyrotoxicosis including risks for arrhythmia and hypercoagulability with subsequent higher risk for stroke and heart attack.  I discouraged her to switch to desiccated thyroid products due to the presence of T3 which can exacerbate the symptoms.  I suggested to start by decreasing the dose of levothyroxine and get her thyroid tests to normal, after which we can reevaluate the need to change formulations.  Patient agrees with the plan.  For now I suggested to decrease the dose to 137 mcg daily. - we discussed about taking the thyroid hormone every day, with water, >30 minutes before breakfast, separated by >4 hours from acid reflux medications, calcium, iron, multivitamins. Pt. is taking it correctly. - will check thyroid tests in 1.5 months after the above change in dose: TSH and fT4 - Otherwise, I will see her back in 3 months for visit with Dr. Roosevelt Locks, but it 5 weeks for labs  2. S/p near complete thyroidectomy -Thyroidectomy is inconspicuous -She has no dysesthesia at the site of the surgery and no masses felt on palpation of the neck - the remnant is small per review of her ultrasound from 2021.  She was under the impression that her thyrotoxicosis is related to her right thyroid lobe overall functioning but I advised her that this is in fact not the case.  She has only a small amount of tissue left in the right lobe, and this is not active. -No further investigation needed for this  Orders Placed This Encounter  Procedures   TSH   T4, free   - Total time spent for the visit: 45 min, in precharting, postcharting, reviewing Dr. Grafton Folk last note, obtaining medical information from the Epic and Care Everywhere and from the pt, reviewing her  previous labs, evaluations, and  treatments, reviewing her symptoms, counseling her about her thyroid disease (please see the discussed topics above), and developing a plan to further investigate and treat it; she had a number of questions which I addressed.  Carlus Pavlov, MD PhD [facility] Endocrinology

## 2023-07-29 NOTE — Telephone Encounter (Signed)
Labs in chart from 8 days ago: GFR=57, Creatinine =1.07

## 2023-08-01 ENCOUNTER — Encounter: Payer: Self-pay | Admitting: Family Medicine

## 2023-08-01 ENCOUNTER — Other Ambulatory Visit: Payer: Self-pay

## 2023-08-01 MED ORDER — EZETIMIBE 10 MG PO TABS: 10.0000 mg | ORAL_TABLET | Freq: Every day | ORAL | 2 refills | Status: DC

## 2023-08-01 MED ORDER — ALENDRONATE SODIUM 70 MG PO TABS: 70.0000 mg | ORAL_TABLET | ORAL | 1 refills | Status: DC

## 2023-08-01 MED ORDER — SIMVASTATIN 20 MG PO TABS: 20.0000 mg | ORAL_TABLET | Freq: Every day | ORAL | 2 refills | Status: DC

## 2023-08-01 NOTE — Telephone Encounter (Signed)
 Refill sent to pharmacy.

## 2023-08-04 ENCOUNTER — Other Ambulatory Visit: Payer: 59

## 2023-08-08 ENCOUNTER — Other Ambulatory Visit: Payer: 59

## 2023-08-08 DIAGNOSIS — E119 Type 2 diabetes mellitus without complications: Secondary | ICD-10-CM

## 2023-08-08 DIAGNOSIS — I10 Essential (primary) hypertension: Secondary | ICD-10-CM

## 2023-08-09 LAB — CMP14+EGFR
ALT: 18 [IU]/L (ref 0–32)
AST: 21 [IU]/L (ref 0–40)
Albumin: 4.7 g/dL (ref 3.9–4.9)
Alkaline Phosphatase: 72 [IU]/L (ref 44–121)
BUN/Creatinine Ratio: 16 (ref 12–28)
BUN: 14 mg/dL (ref 8–27)
Bilirubin Total: 0.7 mg/dL (ref 0.0–1.2)
CO2: 21 mmol/L (ref 20–29)
Calcium: 10.5 mg/dL — ABNORMAL HIGH (ref 8.7–10.3)
Chloride: 101 mmol/L (ref 96–106)
Creatinine, Ser: 0.89 mg/dL (ref 0.57–1.00)
Globulin, Total: 2.4 g/dL (ref 1.5–4.5)
Glucose: 127 mg/dL — ABNORMAL HIGH (ref 70–99)
Potassium: 5.7 mmol/L — ABNORMAL HIGH (ref 3.5–5.2)
Sodium: 142 mmol/L (ref 134–144)
Total Protein: 7.1 g/dL (ref 6.0–8.5)
eGFR: 73 mL/min/{1.73_m2} (ref >59)

## 2023-08-10 ENCOUNTER — Encounter: Payer: Self-pay | Admitting: Family Medicine

## 2023-08-11 ENCOUNTER — Other Ambulatory Visit: Payer: Self-pay | Admitting: Family Medicine

## 2023-08-11 DIAGNOSIS — E875 Hyperkalemia: Secondary | ICD-10-CM

## 2023-08-12 ENCOUNTER — Ambulatory Visit: Payer: BC Managed Care – PPO | Admitting: Cardiovascular Disease

## 2023-08-12 ENCOUNTER — Other Ambulatory Visit: Payer: 59

## 2023-08-12 DIAGNOSIS — E875 Hyperkalemia: Secondary | ICD-10-CM

## 2023-08-13 LAB — COMPREHENSIVE METABOLIC PANEL
ALT: 20 IU/L (ref 0–32)
AST: 18 IU/L (ref 0–40)
Albumin: 4.4 g/dL (ref 3.9–4.9)
Alkaline Phosphatase: 65 IU/L (ref 44–121)
BUN/Creatinine Ratio: 16 (ref 12–28)
BUN: 12 mg/dL (ref 8–27)
Bilirubin Total: 0.6 mg/dL (ref 0.0–1.2)
CO2: 26 mmol/L (ref 20–29)
Calcium: 9.1 mg/dL (ref 8.7–10.3)
Chloride: 101 mmol/L (ref 96–106)
Creatinine, Ser: 0.75 mg/dL (ref 0.57–1.00)
Globulin, Total: 2.1 g/dL (ref 1.5–4.5)
Glucose: 108 mg/dL — ABNORMAL HIGH (ref 70–99)
Potassium: 4.8 mmol/L (ref 3.5–5.2)
Sodium: 139 mmol/L (ref 134–144)
Total Protein: 6.5 g/dL (ref 6.0–8.5)
eGFR: 89 mL/min/{1.73_m2} (ref >59)

## 2023-08-14 ENCOUNTER — Encounter: Payer: Self-pay | Admitting: Family Medicine

## 2023-09-01 ENCOUNTER — Other Ambulatory Visit: Payer: 59

## 2023-09-05 ENCOUNTER — Encounter: Payer: Self-pay | Admitting: Cardiovascular Disease

## 2023-09-05 NOTE — Progress Notes (Unsigned)
  Cardiology Office Note:  .   Date:  09/09/2023  ID:  Claudia Shelton, DOB [birth year], MRN [id number] PCP: Blane Ohara, MD  [facility] Providers Cardiologist:  None    History of Present Illness: . Nov. 22, 2024   Claudia Shelton is a 63 y.o. female with hx of HTN  I am meeting her for the first time today   She has been followed by Dr Ladona Ridgel  She has been having more issues with her BP We started Bystolic 10 Q AM Losartan 50 mg in the evening   Still eats a fairly high salt diet   Walks at work  [employer] at [employer]  No true cardio exercise   September 09, 2023 Claudia Shelton is seen for follow up ofher HTN BP is typically well I encouraged him to take it in the morning    Is tolerating her BP meds.  Is not getting enough cardio exercise   She tried taking the hydrochlorothiazide but it caused lots of leg cramps  Her creatinine and potassium went up with losartan so this was stopped   Amlodipine caused headaches        ROS:   Studies Reviewed: .         Risk Assessment/Calculations:      Physical Exam:     Physical Exam: Blood pressure (!) 150/85, pulse 66, height 5\' 4"  (1.626 m), weight 172 lb 9.6 oz (78.3 kg), SpO2 94%.  HYPERTENSION CONTROL Vitals:   09/09/23 1040 09/09/23 1110  BP: (!) 184/86 (!) 150/85    The patient's blood pressure is elevated above target today.  In order to address the patient's elevated BP:        GEN:  Well nourished, well developed in no acute distress HEENT: Normal NECK: No JVD; No carotid bruits LYMPHATICS: No lymphadenopathy CARDIAC: RRR , no murmurs, rubs, gallops RESPIRATORY:  Clear to auscultation without rales, wheezing or rhonchi  ABDOMEN: Soft, non-tender, non-distended MUSCULOSKELETAL:  No edema; No deformity  SKIN: Warm and dry NEUROLOGIC:  Alert and oriented x 3   ASSESSMENT AND PLAN: .    HTN:  . She did not tolerate the HCTZ because of excessive leg cramps.  We also tried losartan  but this caused her potassium and creatinine to go up. She has been tried on amlodipine but this caused headaches. We discussed hydralazine.  She would like to wait and work on diet, exercise, weight loss before we try hydralazine.        Dispo: 1 year    Signed, Kristeen Miss, MD

## 2023-09-08 ENCOUNTER — Encounter: Payer: Self-pay | Admitting: Gastroenterology

## 2023-09-09 ENCOUNTER — Ambulatory Visit: Payer: 59 | Attending: Cardiovascular Disease | Admitting: Cardiovascular Disease

## 2023-09-09 ENCOUNTER — Other Ambulatory Visit

## 2023-09-09 ENCOUNTER — Encounter: Payer: Self-pay | Admitting: Cardiovascular Disease

## 2023-09-09 VITALS — BP 150/85 | HR 66 | Ht 64.0 in | Wt 172.6 lb

## 2023-09-09 DIAGNOSIS — I10 Essential (primary) hypertension: Secondary | ICD-10-CM

## 2023-09-09 NOTE — Patient Instructions (Signed)
 Follow-Up: At [facility], you and your health needs are our priority.  As part of our continuing mission to provide you with exceptional heart care, our providers are all part of one team.  This team includes your primary Cardiologist (physician) and Advanced Practice Providers or APPs (Physician Assistants and Nurse Practitioners) who all work together to provide you with the care you need, when you need it.  Your next appointment:   1 year(s)  Provider:   Kristeen Miss, MD     1st Floor: - Lobby - Registration  - Pharmacy  - Lab - Cafe  2nd Floor: - PV Lab - Diagnostic Testing (echo, CT, nuclear med)  3rd Floor: - Vacant  4th Floor: - TCTS (cardiothoracic surgery) - AFib Clinic - Structural Heart Clinic - Vascular Surgery  - Vascular Ultrasound  5th Floor: - HeartCare Cardiology (general and EP) - Clinical Pharmacy for coumadin, hypertension, lipid, weight-loss medications, and med management appointments    Valet parking services will be available as well.

## 2023-09-10 LAB — T4, FREE: Free T4: 1.9 ng/dL — ABNORMAL HIGH (ref 0.8–1.8)

## 2023-09-10 LAB — TSH: TSH: 0.68 m[IU]/L (ref 0.40–4.50)

## 2023-09-13 ENCOUNTER — Encounter: Payer: Self-pay | Admitting: Internal Medicine

## 2023-10-06 ENCOUNTER — Encounter: Payer: Self-pay | Admitting: "Endocrinology

## 2023-10-06 ENCOUNTER — Ambulatory Visit: Payer: 59 | Admitting: "Endocrinology

## 2023-10-06 VITALS — BP 130/80 | HR 80 | Ht 64.0 in | Wt 175.0 lb

## 2023-10-06 DIAGNOSIS — E890 Postprocedural hypothyroidism: Secondary | ICD-10-CM

## 2023-10-06 MED ORDER — LEVOTHYROXINE SODIUM 125 MCG PO TABS: 125.0000 ug | ORAL_TABLET | Freq: Every day | ORAL | 3 refills | Status: AC

## 2023-10-06 NOTE — Progress Notes (Signed)
 Outpatient Endocrinology Note Claudia Newcomer, MD  10/06/23   Claudia Shelton 1960-07-29 [id number]  Referring Provider: Mercy Stall, MD Primary Care Provider: Mercy Stall, MD Subjective  No chief complaint on file.   Assessment & Plan  Diagnoses and all orders for this visit:  Postsurgical hypothyroidism -     TSH -     T4, free  Status post partial thyroidectomy  Other orders -     levothyroxine  (SYNTHROID ) 125 MCG tablet; Take 1 tablet (125 mcg total) by mouth daily.   Claudia Shelton is currently taking levothyroxine  137 mcg po every morning.  Previously on alternating dose of armour thyroid  120 mg and 90 mg armour thyroid  that was switched to levothyroxine  on patient request due to expense and patient palpitations. Patient has history of atrial flutter. Doesn't feel any different on levothyroxine  than armour.  Patient is currently biochemically euthyroid.  Educated on thyroid  axis.  09/14/23: Recommend the following: Take levothyroxine  125 mcg po every morning. Advised to take levothyroxine  first thing in the morning on empty stomach and wait at least 30 minutes to 1 hour before eating or drinking anything or taking any other medications. Space out levothyroxine  by 4 hours from any acid reflux medication/fibrate/iron/calcium /multivitamin. Advised to take nutritional supplements in the evening. Repeat lab before next visit or sooner if symptoms of hyperthyroidism or hypothyroidism develop.  Notify us  immediately in case of significant weight gain or loss. Counseled on compliance and follow up needs.  Pt. has been dx with thyroid  disease at the beginning of the 2000s.  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   In  2005, she had left thyroidectomy for goiter [facility]).  She reportedly had " precancerous cells" in the resected lobe.  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    Last thyroid  U/S was:   I have reviewed current medications, nurse's notes, allergies, vital signs, past medical and surgical history, family medical history, and social history for this encounter. Counseled patient on symptoms, examination findings, lab findings, imaging results, treatment decisions and monitoring and prognosis. The patient understood the recommendations and agrees with the treatment plan. All questions regarding treatment plan were fully answered.   Return in about 3 months (around 01/05/2024) for visit + labs before next visit.   Claudia Newcomer, MD  10/06/23  History of Present Illness Claudia Shelton is a 63 y.o. year old female who presents to follow up for hypothyroidism diagnosed around 2004.  S/p L thyroidectomy for "pre-cancerous" cell around 2009 in [place] in [facility]  Tried synthroid  in past but "it did not work" so she has been on armour thyroid  since 2011. On alternating dose of armour thyroid  120 mg and 90 mg armour thyroid  that patient has been happy with, except reports it is expensive and patient gets palpitations which could be coming from it.  Symptoms suggestive of HYPOTHYROIDISM:  fatigue No weight gain No cold intolerance  No constipation  No  Symptoms suggestive of HYPERTHYROIDISM:  weight loss  No heat intolerance No hyperdefecation  No palpitations  Yes  Compressive symptoms:  dysphagia  No dysphonia  No positional dyspnea (especially with simultaneous arms elevation)  No c/o SOB at rest/exertion   Smokes  No On biotin  No  Physical Exam  BP 130/80   Pulse 80   Ht 5\' 4"  (1.626 m)   Wt 175 lb (79.4 kg)   SpO2 98%   BMI 30.04 kg/m  Constitutional: well developed, well nourished Head: normocephalic, atraumatic, no exophthalmos Eyes: sclera anicteric, no redness Neck: no thyromegaly, no thyroid  tenderness;  no nodules palpated. Well healed old thyroidectomy scar  Lungs: normal respiratory effort Neurology: alert and oriented, no fine hand tremor Skin: dry, no appreciable rashes Musculoskeletal: no appreciable defects Psychiatric: normal mood and affect  Allergies Allergies  Allergen Reactions   Adhesive [Tape] Hives and Itching   Almond Oil Itching and Swelling   Amlodipine  Other (See Comments)    Head ache   Latex Hives    Current Medications Patient's Medications  New Prescriptions   LEVOTHYROXINE  (SYNTHROID ) 125 MCG TABLET    Take 1 tablet (125 mcg total) by mouth daily.  Previous Medications   ALENDRONATE  (FOSAMAX ) 70 MG TABLET    Take 1 tablet (70 mg total) by mouth once a week.   CALCIUM  CARBONATE (OS-CAL - DOSED IN MG OF ELEMENTAL CALCIUM ) 1250 (500 CA) MG TABLET    Take 2 tablets by mouth daily with breakfast.   EZETIMIBE  (ZETIA ) 10 MG TABLET    Take 1 tablet (10 mg total) by mouth daily.   HYDROCHLOROTHIAZIDE  (HYDRODIURIL ) 25 MG TABLET    Take 1 tablet (25 mg total) by mouth daily.   LOSARTAN  (COZAAR ) 50 MG TABLET    Take 1 tablet (50 mg total) by mouth daily.   METFORMIN  (GLUCOPHAGE -XR) 500 MG 24 HR TABLET    Take 1 tablet (500 mg total) by mouth 2 (two) times daily.   NEBIVOLOL  (BYSTOLIC ) 10 MG TABLET    Take 1 tablet (10 mg total) by mouth daily.   OMEGA-3 ACID ETHYL ESTERS (LOVAZA ) 1 G CAPSULE    Take 2 capsules (2 g total) by mouth 2 (two) times daily.   POTASSIUM CHLORIDE  (KLOR-CON ) 10 MEQ TABLET    Take 1 tablet (10 mEq total) by mouth daily.   SIMVASTATIN  (ZOCOR ) 20 MG TABLET    Take 1 tablet (20 mg total) by mouth daily.   VITAMIN D , ERGOCALCIFEROL , (DRISDOL ) 50000 UNITS CAPS CAPSULE    Take 50,000 Units by mouth once a week. Thursday  Modified Medications   No medications on file  Discontinued Medications   LEVOTHYROXINE  (SYNTHROID ) 137 MCG TABLET    Take 1 tablet (137 mcg total) by mouth daily before breakfast.    Past Medical History Past Medical History:   Diagnosis Date   Atrial fibrillation (HCC)    BMI 28.0-28.9,adult    Chest pain on exertion 2015   Heart murmur    Hyperlipidemia    Hypertension    Hypothyroidism    half of thyroid  removed d/t large goiter   Impaired fasting glucose    Osteoporosis    Palpitations    Pleuritic pain    right lung base   PONV (postoperative nausea and vomiting)    SOB (shortness of breath) on exertion 2015   TMJ (dislocation of temporomandibular joint)    "not as bad as it was; very minor now" (02/11/2016)   Type II diabetes mellitus (HCC)    "they say I'm prediabetic but I'm on RX" (02/11/2016)    Past Surgical History Past Surgical History:  Procedure Laterality Date   ABDOMINAL HYSTERECTOMY  11/27/2004   Laparoscopic supracervical hysterectomy, not due to cancer   BACK SURGERY     CERVICAL DISCECTOMY  2000s  CESAREAN SECTION  1993; 1998   COLONOSCOPY     ELECTROPHYSIOLOGIC STUDY N/A 02/11/2016   Procedure: A-Fluttter Ablation;  Surgeon: Tammie Fall, MD;  [facility] INVASIVE CV LAB;  Service: Cardiovascular;  Laterality: N/A;   LEFT HEART CATHETERIZATION WITH CORONARY ANGIOGRAM N/A 06/12/2014   Procedure: LEFT HEART CATHETERIZATION WITH CORONARY ANGIOGRAM;  Surgeon: Mickiel Albany, MD;  Location: [facility] CATH LAB;  Service: Cardiovascular;  Laterality: N/A;   THYROIDECTOMY, PARTIAL Left    "pre-cancerous cells"    Family History family history includes Colon polyps in her father; Diabetes Mellitus I in her mother; Heart attack in her paternal uncle; Heart attack (age of onset: 48) in her cousin; Heart attack (age of onset: 44) in her paternal grandfather; Hyperlipidemia in her father; Hypertension in her father; Stroke (age of onset: 64) in her paternal aunt.  Social History Social History   Socioeconomic History   Marital status: Married    Spouse name: Not on file   Number of children: Not on file   Years of education: Not on file   Highest education level: Not on file   Occupational History   Not on file  Tobacco Use   Smoking status: Never   Smokeless tobacco: Never  Vaping Use   Vaping status: Never Used  Substance and Sexual Activity   Alcohol use: No    Alcohol/week: 0.0 standard drinks of alcohol   Drug use: No   Sexual activity: Yes    Birth control/protection: None, Surgical  Other Topics Concern   Not on file  Social History Narrative   Not on file   Social Drivers of Health   Financial Resource Strain: Low Risk  (07/21/2023)   Overall Financial Resource Strain (CARDIA)    Difficulty of Paying Living Expenses: Not hard at all  Food Insecurity: No Food Insecurity (07/21/2023)   Hunger Vital Sign    Worried About Running Out of Food in the Last Year: Never true    Ran Out of Food in the Last Year: Never true  Transportation Needs: No Transportation Needs (07/21/2023)   PRAPARE - Administrator, Civil Service (Medical): No    Lack of Transportation (Non-Medical): No  Physical Activity: Inactive (07/21/2023)   Exercise Vital Sign    Days of Exercise per Week: 0 days    Minutes of Exercise per Session: 0 min  Stress: No Stress Concern Present (07/21/2023)   [employer] of Occupational Health - Occupational Stress Questionnaire    Feeling of Stress : Not at all  Social Connections: Moderately Integrated (07/21/2023)   Social Connection and Isolation Panel [NHANES]    Frequency of Communication with Friends and Family: More than three times a week    Frequency of Social Gatherings with Friends and Family: More than three times a week    Attends Religious Services: More than 4 times per year    Active Member of [employer] or Organizations: No    Attends Banker Meetings: Never    Marital Status: Married  Catering manager Violence: Not At Risk (07/21/2023)   Humiliation, Afraid, Rape, and Kick questionnaire    Fear of Current or Ex-Partner: No    Emotionally Abused: No    Physically Abused: No    Sexually Abused: No     Laboratory Investigations Lab Results  Component Value Date   TSH 0.68 09/09/2023   TSH 0.076 (L) 07/21/2023   TSH 0.17 (L) 04/20/2023   FREET4 1.9 (H) 09/09/2023  FREET4 2.32 (H) 07/21/2023   FREET4 1.68 (H) 04/20/2023     No results found for: "TSI"   No components found for: "TRAB"   Lab Results  Component Value Date   CHOL 159 07/21/2023   Lab Results  Component Value Date   HDL 51 07/21/2023   Lab Results  Component Value Date   LDLCALC 65 07/21/2023   Lab Results  Component Value Date   TRIG 268 (H) 07/21/2023   Lab Results  Component Value Date   CHOLHDL 3.1 07/21/2023   Lab Results  Component Value Date   CREATININE 0.75 08/12/2023   No results found for: "GFR"    Component Value Date/Time   NA 139 08/12/2023 0749   K 4.8 08/12/2023 0749   CL 101 08/12/2023 0749   CO2 26 08/12/2023 0749   GLUCOSE 108 (H) 08/12/2023 0749   GLUCOSE 106 (H) 01/30/2016 0735   BUN 12 08/12/2023 0749   CREATININE 0.75 08/12/2023 0749   CREATININE 0.81 01/30/2016 0735   CALCIUM  9.1 08/12/2023 0749   PROT 6.5 08/12/2023 0749   ALBUMIN 4.4 08/12/2023 0749   AST 18 08/12/2023 0749   ALT 20 08/12/2023 0749   ALKPHOS 65 08/12/2023 0749   BILITOT 0.6 08/12/2023 0749   GFRNONAA >60 01/11/2016 0150   GFRAA >60 01/11/2016 0150      Latest Ref Rng & Units 08/12/2023    7:49 AM 08/08/2023    7:42 AM 07/21/2023    2:14 PM  BMP  Glucose 70 - 99 mg/dL 469  629  528   BUN 8 - 27 mg/dL 12  14  23    Creatinine 0.57 - 1.00 mg/dL 4.13  2.44  0.10   BUN/Creat Ratio 12 - 28 16  16  21    Sodium 134 - 144 mmol/L 139  142  138   Potassium 3.5 - 5.2 mmol/L 4.8  5.7  4.4   Chloride 96 - 106 mmol/L 101  101  94   CO2 20 - 29 mmol/L 26  21  25    Calcium  8.7 - 10.3 mg/dL 9.1  27.2  9.9        Component Value Date/Time   WBC 7.9 07/21/2023 1414   WBC 5.9 01/30/2016 0735   RBC 5.08 07/21/2023 1414   RBC 4.86 01/30/2016 0735   HGB 15.2 07/21/2023 1414   HCT 45.9 07/21/2023 1414    PLT 303 07/21/2023 1414   MCV 90 07/21/2023 1414   MCH 29.9 07/21/2023 1414   MCH 30.0 01/30/2016 0735   MCHC 33.1 07/21/2023 1414   MCHC 34.0 01/30/2016 0735   RDW 12.4 07/21/2023 1414   LYMPHSABS 1.6 07/21/2023 1414   MONOABS 413 01/30/2016 0735   EOSABS 0.2 07/21/2023 1414   BASOSABS 0.1 07/21/2023 1414      Parts of this note may have been dictated using voice recognition software. There may be variances in spelling and vocabulary which are unintentional. Not all errors are proofread. Please notify the [facility] if any discrepancies are noted or if the meaning of any statement is not clear.

## 2023-10-08 ENCOUNTER — Other Ambulatory Visit: Payer: Self-pay

## 2023-10-08 DIAGNOSIS — E782 Mixed hyperlipidemia: Secondary | ICD-10-CM

## 2023-10-08 DIAGNOSIS — I10 Essential (primary) hypertension: Secondary | ICD-10-CM

## 2023-10-08 DIAGNOSIS — E119 Type 2 diabetes mellitus without complications: Secondary | ICD-10-CM

## 2023-10-10 ENCOUNTER — Other Ambulatory Visit: Payer: 59

## 2023-10-10 DIAGNOSIS — E119 Type 2 diabetes mellitus without complications: Secondary | ICD-10-CM

## 2023-10-10 DIAGNOSIS — E782 Mixed hyperlipidemia: Secondary | ICD-10-CM

## 2023-10-10 DIAGNOSIS — I10 Essential (primary) hypertension: Secondary | ICD-10-CM

## 2023-10-11 ENCOUNTER — Encounter: Payer: Self-pay | Admitting: Family Medicine

## 2023-10-11 LAB — CBC WITH DIFFERENTIAL/PLATELET
Basophils Absolute: 0 10*3/uL (ref 0.0–0.2)
Basos: 1 %
EOS (ABSOLUTE): 0.1 10*3/uL (ref 0.0–0.4)
Eos: 3 %
Hematocrit: 45 % (ref 34.0–46.6)
Hemoglobin: 14.9 g/dL (ref 11.1–15.9)
Immature Grans (Abs): 0 10*3/uL (ref 0.0–0.1)
Immature Granulocytes: 0 %
Lymphocytes Absolute: 1.6 10*3/uL (ref 0.7–3.1)
Lymphs: 28 %
MCH: 30 pg (ref 26.6–33.0)
MCHC: 33.1 g/dL (ref 31.5–35.7)
MCV: 91 fL (ref 79–97)
Monocytes Absolute: 0.4 10*3/uL (ref 0.1–0.9)
Monocytes: 8 %
Neutrophils Absolute: 3.4 10*3/uL (ref 1.4–7.0)
Neutrophils: 60 %
Platelets: 247 10*3/uL (ref 150–450)
RBC: 4.96 x10E6/uL (ref 3.77–5.28)
RDW: 12.4 % (ref 11.7–15.4)
WBC: 5.6 10*3/uL (ref 3.4–10.8)

## 2023-10-11 LAB — COMPREHENSIVE METABOLIC PANEL WITH GFR
ALT: 19 IU/L (ref 0–32)
AST: 15 IU/L (ref 0–40)
Albumin: 4.4 g/dL (ref 3.9–4.9)
Alkaline Phosphatase: 76 IU/L (ref 44–121)
BUN/Creatinine Ratio: 24 (ref 12–28)
BUN: 18 mg/dL (ref 8–27)
Bilirubin Total: 0.6 mg/dL (ref 0.0–1.2)
CO2: 22 mmol/L (ref 20–29)
Calcium: 9.6 mg/dL (ref 8.7–10.3)
Chloride: 101 mmol/L (ref 96–106)
Creatinine, Ser: 0.74 mg/dL (ref 0.57–1.00)
Globulin, Total: 2.3 g/dL (ref 1.5–4.5)
Glucose: 116 mg/dL — ABNORMAL HIGH (ref 70–99)
Potassium: 4.5 mmol/L (ref 3.5–5.2)
Sodium: 139 mmol/L (ref 134–144)
Total Protein: 6.7 g/dL (ref 6.0–8.5)
eGFR: 91 mL/min/{1.73_m2} (ref >59)

## 2023-10-11 LAB — LIPID PANEL
Chol/HDL Ratio: 2.4 ratio (ref 0.0–4.4)
Cholesterol, Total: 129 mg/dL (ref 100–199)
HDL: 53 mg/dL (ref >39)
LDL Chol Calc (NIH): 49 mg/dL (ref 0–99)
Triglycerides: 164 mg/dL — ABNORMAL HIGH (ref 0–149)
VLDL Cholesterol Cal: 27 mg/dL (ref 5–40)

## 2023-10-11 LAB — HEMOGLOBIN A1C
Est. average glucose Bld gHb Est-mCnc: 126 mg/dL
Hgb A1c MFr Bld: 6 % — ABNORMAL HIGH (ref 4.8–5.6)

## 2023-10-18 ENCOUNTER — Other Ambulatory Visit: Payer: 59

## 2023-10-18 ENCOUNTER — Encounter: Payer: Self-pay | Admitting: Family Medicine

## 2023-10-18 NOTE — Progress Notes (Signed)
 Subjective:  Patient ID: Claudia Shelton, female    DOB: 1960/12/07  Age: 63 y.o. MRN: [id number]  Chief Complaint  Patient presents with   Medical Management of Chronic Issues    HPI: Hyperlipidemia: Taking zetia  10 mg daily, simvastatin  20 mg daily, Lovaza  2 g BID.  Hypertension: Takes nebivolol  10 mg daily. Patient was previously on lisinopril. It caused her to have decreased urination and she was concerned it was adversely affecting her kidney function.  Hypothyroidism: Currently on levothyroxine  125 mcg daily.  Osteoporosis: ON Fosamax  70 mg every weekly.  Diabetes: Currently taking metformin  500 mg twice daily. Not eating healthy as she should. Walking a couple of times per week.  Oral lesion in left cheek found by her dentist. She has an appointment with an oral surgeon coming up.   Colonoscopy scheduled for June 2025.     10/19/2023    1:23 PM 07/21/2023    1:28 PM 11/11/2022    9:16 AM 09/02/2021    3:10 PM  Depression screen PHQ 2/9  Decreased Interest 0 0 0 0  Down, Depressed, Hopeless 0 0 0 0  PHQ - 2 Score 0 0 0 0  Altered sleeping 0 0 0   Tired, decreased energy 0 0 0   Change in appetite 0 0 0   Feeling bad or failure about yourself  0 0 0   Trouble concentrating 0 0 0   Moving slowly or fidgety/restless 0 0 0   Suicidal thoughts 0 0 0   PHQ-9 Score 0 0 0   Difficult doing work/chores Not difficult at all Not difficult at all Not difficult at all         07/21/2023    1:28 PM  Fall Risk   Falls in the past year? 0  Number falls in past yr: 0  Injury with Fall? 0  Risk for fall due to : No Fall Risks  Follow up Falls evaluation completed    Patient Care Team: Mercy Stall, MD as PCP - General (Family Medicine) Jorge Newcomer, MD as Consulting Physician (Endocrinology) Nahser, Lela Purple, MD as Consulting Physician (Cardiology) Tammie Fall, MD as Consulting Physician (Cardiology)   Review of Systems  Constitutional:  Negative for chills, fatigue and  fever.  HENT:  Negative for congestion, ear pain, rhinorrhea and sore throat.   Respiratory:  Negative for cough and shortness of breath.   Cardiovascular:  Negative for chest pain and palpitations.  Gastrointestinal:  Negative for abdominal pain, constipation, diarrhea, nausea and vomiting.  Endocrine: Negative for polydipsia, polyphagia and polyuria.  Genitourinary:  Negative for difficulty urinating, dysuria and urgency.  Musculoskeletal:  Negative for arthralgias, back pain and myalgias.  Skin:  Negative for rash.  Neurological:  Negative for dizziness, weakness, light-headedness and headaches.  Psychiatric/Behavioral:  Negative for dysphoric mood. The patient is not nervous/anxious.     Current Outpatient Medications on File Prior to Visit  Medication Sig Dispense Refill   alendronate  (FOSAMAX ) 70 MG tablet Take 1 tablet (70 mg total) by mouth once a week. 12 tablet 1   calcium  carbonate (OS-CAL - DOSED IN MG OF ELEMENTAL CALCIUM ) 1250 (500 Ca) MG tablet Take 2 tablets by mouth daily with breakfast.     ezetimibe  (ZETIA ) 10 MG tablet Take 1 tablet (10 mg total) by mouth daily. 30 tablet 2   levothyroxine  (SYNTHROID ) 125 MCG tablet Take 1 tablet (125 mcg total) by mouth daily. 90 tablet 3   metFORMIN  (GLUCOPHAGE -XR) 500  MG 24 hr tablet Take 1 tablet (500 mg total) by mouth 2 (two) times daily. 180 tablet 1   nebivolol  (BYSTOLIC ) 10 MG tablet Take 1 tablet (10 mg total) by mouth daily. 90 tablet 3   omega-3 acid ethyl esters (LOVAZA ) 1 g capsule Take 2 capsules (2 g total) by mouth 2 (two) times daily. 360 capsule 0   simvastatin  (ZOCOR ) 20 MG tablet Take 1 tablet (20 mg total) by mouth daily. 30 tablet 2   Vitamin D , Ergocalciferol , (DRISDOL ) 50000 units CAPS capsule Take 50,000 Units by mouth once a week. Thursday  5   No current facility-administered medications on file prior to visit.   Past Medical History:  Diagnosis Date   Abnormal stress test    Atrial fibrillation (HCC)     BMI 28.0-28.9,adult    Chest pain on exertion 2015   Heart murmur    Hyperlipidemia    Hypertension    Hypothyroidism    half of thyroid  removed d/t large goiter   Impaired fasting glucose    Osteoporosis    Palpitations    Pleuritic pain    right lung base   PONV (postoperative nausea and vomiting)    SOB (shortness of breath) on exertion 2015   TMJ (dislocation of temporomandibular joint)    "not as bad as it was; very minor now" (02/11/2016)   Type II diabetes mellitus (HCC)    "they say I'm prediabetic but I'm on RX" (02/11/2016)   Past Surgical History:  Procedure Laterality Date   ABDOMINAL HYSTERECTOMY  11/27/2004   Laparoscopic supracervical hysterectomy, not due to cancer   BACK SURGERY     CERVICAL DISCECTOMY  2000s   CESAREAN SECTION  1993; 1998   COLONOSCOPY     ELECTROPHYSIOLOGIC STUDY N/A 02/11/2016   Procedure: A-Fluttter Ablation;  Surgeon: Tammie Fall, MD;  Location: [facility] INVASIVE CV LAB;  Service: Cardiovascular;  Laterality: N/A;   LEFT HEART CATHETERIZATION WITH CORONARY ANGIOGRAM N/A 06/12/2014   Procedure: LEFT HEART CATHETERIZATION WITH CORONARY ANGIOGRAM;  Surgeon: Mickiel Albany, MD;  Location: [facility] CATH LAB;  Service: Cardiovascular;  Laterality: N/A;   THYROIDECTOMY, PARTIAL Left    "pre-cancerous cells"    Family History  Problem Relation Age of Onset   Diabetes Mellitus I Mother    Hypertension Father    Hyperlipidemia Father    Colon polyps Father    Heart attack Paternal Grandfather 83   Stroke Paternal Aunt 43   Heart attack Paternal Uncle    Heart attack Cousin 39   Colon cancer Neg Hx    Rectal cancer Neg Hx    Esophageal cancer Neg Hx    Stomach cancer Neg Hx    Social History   Socioeconomic History   Marital status: Married    Spouse name: Not on file   Number of children: Not on file   Years of education: Not on file   Highest education level: Not on file  Occupational History   Not on file  Tobacco Use   Smoking  status: Never   Smokeless tobacco: Never  Vaping Use   Vaping status: Never Used  Substance and Sexual Activity   Alcohol use: No    Alcohol/week: 0.0 standard drinks of alcohol   Drug use: No   Sexual activity: Yes    Birth control/protection: None, Surgical  Other Topics Concern   Not on file  Social History Narrative   Not on file   Social Drivers of  Health   Financial Resource Strain: Low Risk  (07/21/2023)   Overall Financial Resource Strain (CARDIA)    Difficulty of Paying Living Expenses: Not hard at all  Food Insecurity: No Food Insecurity (07/21/2023)   Hunger Vital Sign    Worried About Running Out of Food in the Last Year: Never true    Ran Out of Food in the Last Year: Never true  Transportation Needs: No Transportation Needs (07/21/2023)   PRAPARE - Administrator, Civil Service (Medical): No    Lack of Transportation (Non-Medical): No  Physical Activity: Inactive (07/21/2023)   Exercise Vital Sign    Days of Exercise per Week: 0 days    Minutes of Exercise per Session: 0 min  Stress: No Stress Concern Present (07/21/2023)   [employer] of Occupational Health - Occupational Stress Questionnaire    Feeling of Stress : Not at all  Social Connections: Moderately Integrated (07/21/2023)   Social Connection and Isolation Panel [NHANES]    Frequency of Communication with Friends and Family: More than three times a week    Frequency of Social Gatherings with Friends and Family: More than three times a week    Attends Religious Services: More than 4 times per year    Active Member of [employer] or Organizations: No    Attends Engineer, structural: Never    Marital Status: Married    Objective:  BP 130/70   Pulse 62   Temp 97.8 F (36.6 C)   Ht 5\' 4"  (1.626 m)   Wt 175 lb (79.4 kg)   SpO2 97%   BMI 30.04 kg/m      10/19/2023    1:22 PM 10/06/2023    3:10 PM 09/09/2023   11:10 AM  BP/Weight  Systolic BP 130 130 150  Diastolic BP 70 80 85  Wt.  (Lbs) 175 175   BMI 30.04 kg/m2 30.04 kg/m2     Physical Exam Vitals reviewed.  Constitutional:      Appearance: Normal appearance.  Neck:     Vascular: No carotid bruit.  Cardiovascular:     Rate and Rhythm: Normal rate and regular rhythm.     Pulses: Normal pulses.     Heart sounds: Normal heart sounds.  Pulmonary:     Effort: Pulmonary effort is normal. No respiratory distress.     Breath sounds: Normal breath sounds.  Abdominal:     General: Abdomen is flat. Bowel sounds are normal.     Palpations: Abdomen is soft.     Tenderness: There is no abdominal tenderness.  Neurological:     Mental Status: She is alert and oriented to person, place, and time.  Psychiatric:        Mood and Affect: Mood normal.        Behavior: Behavior normal.     Diabetic Foot Exam - Simple   Simple Foot Form Diabetic Foot exam was performed with the following findings: Yes 10/19/2023  2:07 PM  Visual Inspection See comments: Yes Sensation Testing Intact to touch and monofilament testing bilaterally: Yes Pulse Check Posterior Tibialis and Dorsalis pulse intact bilaterally: Yes Comments Great toe nails yellowed.       Lab Results  Component Value Date   WBC 5.6 10/10/2023   HGB 14.9 10/10/2023   HCT 45.0 10/10/2023   PLT 247 10/10/2023   GLUCOSE 116 (H) 10/10/2023   CHOL 129 10/10/2023   TRIG 164 (H) 10/10/2023   HDL 53 10/10/2023   LDLCALC  49 10/10/2023   ALT 19 10/10/2023   AST 15 10/10/2023   NA 139 10/10/2023   K 4.5 10/10/2023   CL 101 10/10/2023   CREATININE 0.74 10/10/2023   BUN 18 10/10/2023   CO2 22 10/10/2023   TSH 0.68 09/09/2023   INR 0.93 06/12/2014   HGBA1C 6.0 (H) 10/10/2023      Assessment & Plan:  Primary hypertension Assessment & Plan: Well controlled.  No changes to medicines. Takes nebivolol  10 mg daily.  Continue to work on eating a healthy diet and exercise.     Acquired hypothyroidism Assessment & Plan: Management per specialist,  Dr.  Vertell Gory   Mixed hyperlipidemia Assessment & Plan: Well controlled.  No changes to medicines. Taking zetia  10 mg daily, simvastatin  20 mg daily, Lovaza  2 g BID.  Continue to work on eating a healthy diet and exercise.     Diabetes mellitus without complication (HCC) Assessment & Plan: Control: Well controlled  Recommend check sugars fasting daily. Recommend check feet daily. Recommend annual eye exams. Medicines: Currently taking metformin  500 mg twice daily.  Continue to work on eating a healthy diet and exercise.    Orders: -     Microalbumin / creatinine urine ratio     No orders of the defined types were placed in this encounter.   Orders Placed This Encounter  Procedures   Microalbumin / creatinine urine ratio     Follow-up: Return in about 6 months (around 04/20/2024) for chronic follow up.   I,Marla I Leal-Borjas,acting as a scribe for Mercy Stall, MD.,have documented all relevant documentation on the behalf of Mercy Stall, MD,as directed by  Mercy Stall, MD while in the presence of Mercy Stall, MD.   An After Visit Summary was printed and given to the patient.  I attest that I have reviewed this visit and agree with the plan scribed by my staff.   Mercy Stall, MD [facility] [phone]

## 2023-10-19 ENCOUNTER — Encounter: Payer: Self-pay | Admitting: Family Medicine

## 2023-10-19 ENCOUNTER — Ambulatory Visit: Payer: 59 | Admitting: Family Medicine

## 2023-10-19 VITALS — BP 130/70 | HR 62 | Temp 97.8°F | Ht 64.0 in | Wt 175.0 lb

## 2023-10-19 DIAGNOSIS — E039 Hypothyroidism, unspecified: Secondary | ICD-10-CM

## 2023-10-19 DIAGNOSIS — I10 Essential (primary) hypertension: Secondary | ICD-10-CM

## 2023-10-19 DIAGNOSIS — E119 Type 2 diabetes mellitus without complications: Secondary | ICD-10-CM | POA: Diagnosis not present

## 2023-10-19 DIAGNOSIS — E782 Mixed hyperlipidemia: Secondary | ICD-10-CM | POA: Diagnosis not present

## 2023-10-20 ENCOUNTER — Encounter: Payer: Self-pay | Admitting: Family Medicine

## 2023-10-20 LAB — MICROALBUMIN / CREATININE URINE RATIO
Creatinine, Urine: 131.9 mg/dL
Microalb/Creat Ratio: 25 mg/g{creat} (ref 0–29)
Microalbumin, Urine: 32.6 ug/mL

## 2023-10-20 NOTE — Assessment & Plan Note (Signed)
 Well controlled.  No changes to medicines. Takes nebivolol  10 mg daily.  Continue to work on eating a healthy diet and exercise.

## 2023-10-20 NOTE — Assessment & Plan Note (Signed)
 Well controlled.  No changes to medicines. Taking zetia  10 mg daily, simvastatin  20 mg daily, Lovaza  2 g BID.  Continue to work on eating a healthy diet and exercise.

## 2023-10-20 NOTE — Assessment & Plan Note (Signed)
 Control: Well controlled  Recommend check sugars fasting daily. Recommend check feet daily. Recommend annual eye exams. Medicines: Currently taking metformin  500 mg twice daily.  Continue to work on eating a healthy diet and exercise.

## 2023-10-20 NOTE — Assessment & Plan Note (Signed)
 Management per specialist,  Dr. Vertell Gory

## 2023-10-27 ENCOUNTER — Other Ambulatory Visit: Payer: Self-pay | Admitting: Family Medicine

## 2023-11-09 ENCOUNTER — Other Ambulatory Visit: Payer: Self-pay | Admitting: Family Medicine

## 2023-11-18 ENCOUNTER — Other Ambulatory Visit: Payer: Self-pay

## 2023-11-18 ENCOUNTER — Ambulatory Visit

## 2023-11-18 VITALS — Ht 64.0 in | Wt 170.0 lb

## 2023-11-18 DIAGNOSIS — Z860100 Personal history of colon polyps, unspecified: Secondary | ICD-10-CM

## 2023-11-18 MED ORDER — NA SULFATE-K SULFATE-MG SULF 17.5-3.13-1.6 GM/177ML PO SOLN: 1.0000 | Freq: Once | ORAL | 0 refills | Status: AC

## 2023-11-18 NOTE — Progress Notes (Signed)
 Denies allergies to eggs or soy products. Denies complication of anesthesia or sedation. Denies use of weight loss medication. Denies use of O2.   Emmi instructions given for colonoscopy.

## 2023-11-25 ENCOUNTER — Encounter: Payer: Self-pay | Admitting: Gastroenterology

## 2023-12-08 ENCOUNTER — Telehealth: Payer: Self-pay | Admitting: Gastroenterology

## 2023-12-08 NOTE — Telephone Encounter (Signed)
 Patient requested prep instructions.

## 2023-12-09 ENCOUNTER — Encounter: Payer: Self-pay | Admitting: Gastroenterology

## 2023-12-09 ENCOUNTER — Ambulatory Visit: Admitting: Gastroenterology

## 2023-12-09 VITALS — BP 129/79 | HR 74 | Temp 97.7°F | Resp 12 | Ht 64.0 in | Wt 170.0 lb

## 2023-12-09 DIAGNOSIS — Z860100 Personal history of colon polyps, unspecified: Secondary | ICD-10-CM

## 2023-12-09 DIAGNOSIS — K635 Polyp of colon: Secondary | ICD-10-CM

## 2023-12-09 DIAGNOSIS — Z1211 Encounter for screening for malignant neoplasm of colon: Secondary | ICD-10-CM | POA: Diagnosis present

## 2023-12-09 DIAGNOSIS — K644 Residual hemorrhoidal skin tags: Secondary | ICD-10-CM

## 2023-12-09 DIAGNOSIS — D127 Benign neoplasm of rectosigmoid junction: Secondary | ICD-10-CM | POA: Diagnosis not present

## 2023-12-09 DIAGNOSIS — Z860101 Personal history of adenomatous and serrated colon polyps: Secondary | ICD-10-CM | POA: Diagnosis not present

## 2023-12-09 DIAGNOSIS — D122 Benign neoplasm of ascending colon: Secondary | ICD-10-CM

## 2023-12-09 DIAGNOSIS — K648 Other hemorrhoids: Secondary | ICD-10-CM

## 2023-12-09 DIAGNOSIS — D128 Benign neoplasm of rectum: Secondary | ICD-10-CM | POA: Diagnosis not present

## 2023-12-09 DIAGNOSIS — D125 Benign neoplasm of sigmoid colon: Secondary | ICD-10-CM | POA: Diagnosis not present

## 2023-12-09 MED ORDER — SODIUM CHLORIDE 0.9 % IV SOLN: 500.0000 mL | Freq: Once | INTRAVENOUS | Status: DC

## 2023-12-09 NOTE — Progress Notes (Signed)
 Called to room to assist during endoscopic procedure.  Patient ID and intended procedure confirmed with present staff. Received instructions for my participation in the procedure from the performing physician.

## 2023-12-09 NOTE — Op Note (Signed)
 [facility] Patient Name: Claudia Shelton Procedure Date: 12/09/2023 7:28 AM MRN: [id number] Endoscopist: Gustav ALONSO Mcgee , MD, [id number] Age: 63 Referring MD:  Date of Birth: 07-Dec-1960 Gender: Female Account #: [id number] Procedure:                Colonoscopy Indications:              High risk colon cancer surveillance: Personal                            history of adenoma less than 10 mm in size Medicines:                Monitored Anesthesia Care Procedure:                Pre-Anesthesia Assessment:                           - Prior to the procedure, a History and Physical                            was performed, and patient medications and                            allergies were reviewed. The patient's tolerance of                            previous anesthesia was also reviewed. The risks                            and benefits of the procedure and the sedation                            options and risks were discussed with the patient.                            All questions were answered, and informed consent                            was obtained. Prior Anticoagulants: The patient has                            taken no anticoagulant or antiplatelet agents. ASA                            Grade Assessment: II - A patient with mild systemic                            disease. After reviewing the risks and benefits,                            the patient was deemed in satisfactory condition to                            undergo the procedure.  After obtaining informed consent, the colonoscope                            was passed under direct vision. Throughout the                            procedure, the patient's blood pressure, pulse, and                            oxygen saturations were monitored continuously. The                            Olympus Scope [id number] was introduced through the                            anus and advanced  to the the cecum, identified by                            appendiceal orifice and ileocecal valve. The                            colonoscopy was performed without difficulty. The                            patient tolerated the procedure well. The quality                            of the bowel preparation was good. The ileocecal                            valve, appendiceal orifice, and rectum were                            photographed. Scope In: 8:06:12 AM Scope Out: 8:24:16 AM Scope Withdrawal Time: 0 hours 15 minutes 17 seconds  Total Procedure Duration: 0 hours 18 minutes 4 seconds  Findings:                 The perianal and digital rectal examinations were                            normal.                           Two sessile polyps were found in the sigmoid colon                            and ascending colon. The polyps were 4 to 7 mm in                            size. These polyps were removed with a cold snare.                            Resection and retrieval were complete.  A 2 mm polyp was found in the rectum. The polyp was                            sessile. The polyp was removed with a cold biopsy                            forceps. Resection and retrieval were complete.                           Non-bleeding external and internal hemorrhoids were                            found during retroflexion. The hemorrhoids were                            medium-sized. Complications:            No immediate complications. Estimated Blood Loss:     Estimated blood loss was minimal. Impression:               - Two 4 to 7 mm polyps in the sigmoid colon and in                            the ascending colon, removed with a cold snare.                            Resected and retrieved.                           - One 2 mm polyp in the rectum, removed with a cold                            biopsy forceps. Resected and retrieved.                            - Non-bleeding external and internal hemorrhoids. Recommendation:           - Patient has a contact number available for                            emergencies. The signs and symptoms of potential                            delayed complications were discussed with the                            patient. Return to normal activities tomorrow.                            Written discharge instructions were provided to the                            patient.                           -  Resume previous diet.                           - Continue present medications.                           - Await pathology results.                           - Repeat colonoscopy in 3 - 5 years for                            surveillance based on pathology results. Venise Ellingwood V. Ladarious Kresse, MD 12/09/2023 8:35:00 AM This report has been signed electronically.

## 2023-12-09 NOTE — Patient Instructions (Addendum)
 Resume previous diet Await pathology results Handout on polyps given   YOU HAD AN ENDOSCOPIC PROCEDURE TODAY AT THE [facility]:   Refer to the procedure report that was given to you for any specific questions about what was found during the examination.  If the procedure report does not answer your questions, please call your gastroenterologist to clarify.  If you requested that your care partner not be given the details of your procedure findings, then the procedure report has been included in a sealed envelope for you to review at your convenience later.  YOU SHOULD EXPECT: Some feelings of bloating in the abdomen. Passage of more gas than usual.  Walking can help get rid of the air that was put into your GI tract during the procedure and reduce the bloating. If you had a lower endoscopy (such as a colonoscopy or flexible sigmoidoscopy) you may notice spotting of blood in your stool or on the toilet paper. If you underwent a bowel prep for your procedure, you may not have a normal bowel movement for a few days.  Please Note:  You might notice some irritation and congestion in your nose or some drainage.  This is from the oxygen used during your procedure.  There is no need for concern and it should clear up in a day or so.  SYMPTOMS TO REPORT IMMEDIATELY:  Following lower endoscopy (colonoscopy or flexible sigmoidoscopy):  Excessive amounts of blood in the stool  Significant tenderness or worsening of abdominal pains  Swelling of the abdomen that is new, acute  Fever of 100F or higher   For urgent or emergent issues, a gastroenterologist can be reached at any hour by calling [phone]. Do not use MyChart messaging for urgent concerns.    DIET:  We do recommend a small meal at first, but then you may proceed to your regular diet.  Drink plenty of fluids but you should avoid alcoholic beverages for 24 hours.  ACTIVITY:  You should plan to take it easy for the rest of  today and you should NOT DRIVE or use heavy machinery until tomorrow (because of the sedation medicines used during the test).    FOLLOW UP: Our staff will call the number listed on your records the next business day following your procedure.  We will call around 7:15- 8:00 am to check on you and address any questions or concerns that you may have regarding the information given to you following your procedure. If we do not reach you, we will leave a message.     If any biopsies were taken you will be contacted by phone or by letter within the next 1-3 weeks.  Please call us  at [phone] if you have not heard about the biopsies in 3 weeks.    SIGNATURES/CONFIDENTIALITY: You and/or your care partner have signed paperwork which will be entered into your electronic medical record.  These signatures attest to the fact that that the information above on your After Visit Summary has been reviewed and is understood.  Full responsibility of the confidentiality of this discharge information lies with you and/or your care-partner.

## 2023-12-09 NOTE — Progress Notes (Signed)
 To pacu, VSS. Report to [email or web address]

## 2023-12-09 NOTE — Progress Notes (Signed)
 Vitals-Autumn  Pt's states no medical or surgical changes since previsit or office visit.

## 2023-12-09 NOTE — Progress Notes (Signed)
 [facility] History and Physical   Primary Care Physician:  Sherre Clapper, MD   Reason for Procedure:  History of adenomatous colon polyps  Plan:    Surveillance colonoscopy with possible interventions as needed     HPI: Claudia Shelton is a very pleasant 63 y.o. female here for surveillance colonoscopy. Denies any nausea, vomiting, abdominal pain, melena or bright red blood per rectum  The risks and benefits as well as alternatives of endoscopic procedure(s) have been discussed and reviewed. All questions answered. The patient agrees to proceed.    Past Medical History:  Diagnosis Date   Abnormal stress test    Arthritis    Atrial fibrillation (HCC)    BMI 28.0-28.9,adult    Chest pain on exertion 2015   Heart murmur    Hyperlipidemia    Hypertension    Hypothyroidism    half of thyroid  removed d/t large goiter   Impaired fasting glucose    Osteoporosis    Palpitations    Pleuritic pain    right lung base   PONV (postoperative nausea and vomiting)    SOB (shortness of breath) on exertion 2015   TMJ (dislocation of temporomandibular joint)    not as bad as it was; very minor now (02/11/2016)   Type II diabetes mellitus (HCC)    they say I'm prediabetic but I'm on RX (02/11/2016)    Past Surgical History:  Procedure Laterality Date   ABDOMINAL HYSTERECTOMY  11/27/2004   Laparoscopic supracervical hysterectomy, not due to cancer   BACK SURGERY     CERVICAL DISCECTOMY  2000s   CESAREAN SECTION  1993; 1998   COLONOSCOPY     ELECTROPHYSIOLOGIC STUDY N/A 02/11/2016   Procedure: A-Fluttter Ablation;  Surgeon: Danelle LELON Birmingham, MD;  Location: [facility] INVASIVE CV LAB;  Service: Cardiovascular;  Laterality: N/A;   LEFT HEART CATHETERIZATION WITH CORONARY ANGIOGRAM N/A 06/12/2014   Procedure: LEFT HEART CATHETERIZATION WITH CORONARY ANGIOGRAM;  Surgeon: Victory LELON Claudene DOUGLAS, MD;  Location: [facility] [place] CATH LAB;  Service: Cardiovascular;  Laterality: N/A;   THYROIDECTOMY, PARTIAL  Left    pre-cancerous cells    Prior to Admission medications   Medication Sig Start Date End Date Taking? Authorizing Provider  calcium  carbonate (OS-CAL - DOSED IN MG OF ELEMENTAL CALCIUM ) 1250 (500 Ca) MG tablet Take 2 tablets by mouth daily with breakfast.   Yes [provider]  ezetimibe  (ZETIA ) 10 MG tablet TAKE 1 TABLET BY MOUTH EVERY DAY 10/27/23  Yes Cox, Kirsten, MD  levothyroxine  (SYNTHROID ) 125 MCG tablet Take 1 tablet (125 mcg total) by mouth daily. 10/06/23  Yes Motwani, Komal, MD  metFORMIN  (GLUCOPHAGE -XR) 500 MG 24 hr tablet Take 1 tablet (500 mg total) by mouth 2 (two) times daily. 06/22/23  Yes Cox, Kirsten, MD  nebivolol  (BYSTOLIC ) 10 MG tablet Take 1 tablet (10 mg total) by mouth daily. 03/21/23  Yes Nahser, Aleene PARAS, MD  omega-3 acid ethyl esters (LOVAZA ) 1 g capsule TAKE 2 CAPSULES BY MOUTH 2 TIMES DAILY. 11/09/23  Yes Cox, Kirsten, MD  simvastatin  (ZOCOR ) 20 MG tablet TAKE 1 TABLET BY MOUTH EVERY DAY 10/27/23  Yes Cox, Kirsten, MD  alendronate  (FOSAMAX ) 70 MG tablet Take 1 tablet (70 mg total) by mouth once a week. 08/01/23   CoxClapper, MD  Vitamin D , Ergocalciferol , (DRISDOL ) 50000 units CAPS capsule Take 50,000 Units by mouth once a week. Thursday 01/11/16   [provider]    Current Outpatient Medications  Medication Sig Dispense Refill  calcium  carbonate (OS-CAL - DOSED IN MG OF ELEMENTAL CALCIUM ) 1250 (500 Ca) MG tablet Take 2 tablets by mouth daily with breakfast.     ezetimibe  (ZETIA ) 10 MG tablet TAKE 1 TABLET BY MOUTH EVERY DAY 90 tablet 0   levothyroxine  (SYNTHROID ) 125 MCG tablet Take 1 tablet (125 mcg total) by mouth daily. 90 tablet 3   metFORMIN  (GLUCOPHAGE -XR) 500 MG 24 hr tablet Take 1 tablet (500 mg total) by mouth 2 (two) times daily. 180 tablet 1   nebivolol  (BYSTOLIC ) 10 MG tablet Take 1 tablet (10 mg total) by mouth daily. 90 tablet 3   omega-3 acid ethyl esters (LOVAZA ) 1 g capsule TAKE 2 CAPSULES BY MOUTH 2 TIMES DAILY. 360 capsule  0   simvastatin  (ZOCOR ) 20 MG tablet TAKE 1 TABLET BY MOUTH EVERY DAY 90 tablet 0   alendronate  (FOSAMAX ) 70 MG tablet Take 1 tablet (70 mg total) by mouth once a week. 12 tablet 1   Vitamin D , Ergocalciferol , (DRISDOL ) 50000 units CAPS capsule Take 50,000 Units by mouth once a week. Thursday  5   Current Facility-Administered Medications  Medication Dose Route Frequency Provider Last Rate Last Admin   0.9 %  sodium chloride  infusion  500 mL Intravenous Once Royal Vandevoort V, MD        Allergies as of 12/09/2023 - Review Complete 11/18/2023  Allergen Reaction Noted   Amlodipine  Other (See Comments) 09/09/2023   Almond oil Itching and Swelling 02/11/2016   Latex Hives 06/10/2014   Adhesive [tape] Hives and Itching 06/10/2014    Family History  Problem Relation Age of Onset   Diabetes Mellitus I Mother    Hypertension Father    Hyperlipidemia Father    Colon polyps Father    Heart attack Paternal Grandfather 21   Stroke Paternal Aunt 72   Heart attack Paternal Uncle    Heart attack Cousin 45   Colon cancer Neg Hx    Rectal cancer Neg Hx    Esophageal cancer Neg Hx    Stomach cancer Neg Hx     Social History   Socioeconomic History   Marital status: Married    Spouse name: Not on file   Number of children: Not on file   Years of education: Not on file   Highest education level: Not on file  Occupational History   Not on file  Tobacco Use   Smoking status: Never   Smokeless tobacco: Never  Vaping Use   Vaping status: Never Used  Substance and Sexual Activity   Alcohol use: No    Alcohol/week: 0.0 standard drinks of alcohol   Drug use: No   Sexual activity: Yes    Birth control/protection: None, Surgical  Other Topics Concern   Not on file  Social History Narrative   Not on file   Social Drivers of Health   Financial Resource Strain: Low Risk  (07/21/2023)   Overall Financial Resource Strain (CARDIA)    Difficulty of Paying Living Expenses: Not hard at all   Food Insecurity: No Food Insecurity (07/21/2023)   Hunger Vital Sign    Worried About Running Out of Food in the Last Year: Never true    Ran Out of Food in the Last Year: Never true  Transportation Needs: No Transportation Needs (07/21/2023)   PRAPARE - Administrator, Civil Service (Medical): No    Lack of Transportation (Non-Medical): No  Physical Activity: Inactive (07/21/2023)   Exercise Vital Sign    Days of  Exercise per Week: 0 days    Minutes of Exercise per Session: 0 min  Stress: No Stress Concern Present (07/21/2023)   [employer] of Occupational Health - Occupational Stress Questionnaire    Feeling of Stress : Not at all  Social Connections: Moderately Integrated (07/21/2023)   Social Connection and Isolation Panel    Frequency of Communication with Friends and Family: More than three times a week    Frequency of Social Gatherings with Friends and Family: More than three times a week    Attends Religious Services: More than 4 times per year    Active Member of [employer] or Organizations: No    Attends Banker Meetings: Never    Marital Status: Married  Catering manager Violence: Not At Risk (07/21/2023)   Humiliation, Afraid, Rape, and Kick questionnaire    Fear of Current or Ex-Partner: No    Emotionally Abused: No    Physically Abused: No    Sexually Abused: No    Review of Systems:  All other review of systems negative except as mentioned in the HPI.  Physical Exam: Vital signs in last 24 hours: BP (!) 170/76 (BP Location: Right Arm, Patient Position: Sitting, Cuff Size: Normal)   Pulse (!) 57   Temp 97.7 F (36.5 C) (Temporal)   Ht 5' 4 (1.626 m)   Wt 170 lb (77.1 kg)   SpO2 98%   BMI 29.18 kg/m  General:   Alert, NAD Lungs:  Clear .   Heart:  Regular rate and rhythm Abdomen:  Soft, nontender and nondistended. Neuro/Psych:  Alert and cooperative. Normal mood and affect. A and O x 3  Reviewed labs, radiology imaging, old records and  pertinent past GI work up  Patient is appropriate for planned procedure(s) and anesthesia in an ambulatory setting   K. Veena Claudia Shelton , MD [phone]

## 2023-12-12 ENCOUNTER — Telehealth: Payer: Self-pay

## 2023-12-12 NOTE — Telephone Encounter (Signed)
  Follow up Call-     12/09/2023    7:23 AM 12/09/2023    7:18 AM  Call back number  Post procedure Call Back phone  # [phone]   Permission to leave phone message  Yes     Patient questions:  Do you have a fever, pain , or abdominal swelling? No. Pain Score  0 *  Have you tolerated food without any problems? Yes.    Have you been able to return to your normal activities? Yes.    Do you have any questions about your discharge instructions: Diet   No. Medications  No. Follow up visit  No.  Do you have questions or concerns about your Care? No.  Actions: * If pain score is 4 or above: No action needed, pain <4.

## 2023-12-15 LAB — SURGICAL PATHOLOGY

## 2023-12-16 ENCOUNTER — Other Ambulatory Visit: Payer: Self-pay | Admitting: "Endocrinology

## 2024-01-06 ENCOUNTER — Other Ambulatory Visit

## 2024-01-12 ENCOUNTER — Other Ambulatory Visit: Payer: Self-pay | Admitting: Family Medicine

## 2024-01-12 ENCOUNTER — Ambulatory Visit: Admitting: "Endocrinology

## 2024-01-27 ENCOUNTER — Other Ambulatory Visit: Payer: Self-pay | Admitting: Family Medicine

## 2024-01-27 ENCOUNTER — Other Ambulatory Visit

## 2024-01-28 ENCOUNTER — Encounter: Payer: Self-pay | Admitting: "Endocrinology

## 2024-01-30 ENCOUNTER — Other Ambulatory Visit: Payer: Self-pay | Admitting: "Endocrinology

## 2024-01-30 DIAGNOSIS — E890 Postprocedural hypothyroidism: Secondary | ICD-10-CM

## 2024-01-30 LAB — TEST AUTHORIZATION

## 2024-01-30 LAB — TSH: TSH: 1.41 m[IU]/L (ref 0.40–4.50)

## 2024-01-30 LAB — T4, FREE: Free T4: 1.6 ng/dL (ref 0.8–1.8)

## 2024-01-30 LAB — T3, FREE: T3, Free: 2.8 pg/mL (ref 2.3–4.2)

## 2024-02-02 ENCOUNTER — Encounter: Payer: Self-pay | Admitting: "Endocrinology

## 2024-02-02 ENCOUNTER — Telehealth: Admitting: "Endocrinology

## 2024-02-02 VITALS — Ht 64.0 in | Wt 170.0 lb

## 2024-02-02 DIAGNOSIS — E890 Postprocedural hypothyroidism: Secondary | ICD-10-CM

## 2024-02-02 NOTE — Progress Notes (Signed)
 The patient reports they are currently: [place]. I spent 4-5 minutes on the video with the patient on the date of service. I spent an additional 5 minutes on pre- and post-visit activities on the date of service.   The patient was physically located in [place]  or a state in which I am permitted to provide care. The patient and/or parent/guardian understood that s/he may incur co-pays and cost sharing, and agreed to the telemedicine visit. The visit was reasonable and appropriate under the circumstances given the patient's presentation at the time.  The patient and/or parent/guardian has been advised of the potential risks and limitations of this mode of treatment (including, but not limited to, the absence of in-person examination) and has agreed to be treated using telemedicine. The patient's/patient's family's questions regarding telemedicine have been answered.   The patient and/or parent/guardian has also been advised to contact their provider's office for worsening conditions, and seek emergency medical treatment and/or call 911 if the patient deems either necessary.     Outpatient Endocrinology Note Obadiah Birmingham, MD  02/02/24   Claudia Shelton 64-Feb-1962 [id number]  Referring Provider: Sherre Clapper, MD Primary Care Provider: Sherre Clapper, MD Subjective  No chief complaint on file.   Assessment & Plan  Diagnoses and all orders for this visit:  Postsurgical hypothyroidism -     TSH + free T4  Status post partial thyroidectomy    Claudia Shelton is currently taking levothyroxine  125 mcg po every morning.  Previously on alternating dose of armour thyroid  120 mg and 90 mg armour thyroid  that was switched to levothyroxine  on patient request due to expense and patient palpitations. Patient has history of atrial flutter. Doesn't feel any different on levothyroxine  than armour.  Patient is currently biochemically euthyroid.  Educated on thyroid  axis.  09/14/23: Recommend the  following: continue levothyroxine  125 mcg po every morning. Advised to take levothyroxine  first thing in the morning on empty stomach and wait at least 30 minutes to 1 hour before eating or drinking anything or taking any other medications. Space out levothyroxine  by 4 hours from any acid reflux medication/fibrate/iron/calcium /multivitamin. Advised to take nutritional supplements in the evening. Repeat lab before next visit or sooner if symptoms of hyperthyroidism or hypothyroidism develop.  Notify us  immediately in case of significant weight gain or loss. Counseled on compliance and follow up needs.  Pt. has been dx with thyroid  disease at the beginning of the 2000s.  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   In  2005, she had left thyroidectomy for goiter [facility]).  She reportedly had  precancerous cells in the resected lobe.  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 R residual was 1.3 cm No follow up U/S unless clinically indicated     Last thyroid  U/S was:   I have reviewed current medications, nurse's notes, allergies, vital signs, past medical and surgical history, family medical history, and social history for this encounter. Counseled patient on symptoms, examination findings, lab findings, imaging results, treatment decisions and monitoring and prognosis. The patient understood the recommendations and agrees with the treatment plan. All questions regarding treatment plan were fully answered.   Return in about 6 months (around 08/04/2024) for visit + labs before next visit.   Obadiah Birmingham, MD  02/02/24  History of Present Illness  Claudia Shelton is a 63 y.o. year old female who presents to follow up for hypothyroidism diagnosed around 2004.  S/p L thyroidectomy for  pre-cancerous cell around 2009 in [place] in [facility]  Tried synthroid  in past but it did not work so she has been on armour thyroid  since 2011. On alternating dose of armour thyroid  120 mg and 90 mg armour thyroid  that patient has been happy with, except reports it is expensive and patient gets palpitations which could be coming from it.  Symptoms suggestive of HYPOTHYROIDISM:  fatigue No weight gain No cold intolerance  No constipation  No  Symptoms suggestive of HYPERTHYROIDISM:  weight loss  No heat intolerance No hyperdefecation  No palpitations  Yes  Compressive symptoms:  dysphagia  No dysphonia  No positional dyspnea (especially with simultaneous arms elevation)  No c/o SOB at rest/exertion   Smokes  No On biotin  No  Physical Exam  Ht 5' 4 (1.626 m)   Wt 170 lb (77.1 kg)   BMI 29.18 kg/m  Constitutional: well developed, well nourished Head: normocephalic, atraumatic, no exophthalmos Eyes: sclera anicteric, no redness Neck: no thyromegaly, no thyroid  tenderness; no nodules palpated. Well healed old thyroidectomy scar  Lungs: normal respiratory effort Neurology: alert and oriented, no fine hand tremor Skin: dry, no appreciable rashes Musculoskeletal: no appreciable defects Psychiatric: normal mood and affect  Allergies Allergies  Allergen Reactions   Amlodipine  Other (See Comments)    Head ache   Almond Oil Itching and Swelling   Latex Hives   Adhesive [Tape] Hives and Itching    Current Medications Patient's Medications  New Prescriptions   No medications on file  Previous Medications   ALENDRONATE  (FOSAMAX ) 70 MG TABLET    TAKE 1 TABLET (70 MG TOTAL) BY MOUTH ONCE A WEEK   CALCIUM  CARBONATE (OS-CAL - DOSED IN MG OF ELEMENTAL CALCIUM ) 1250 (500 CA) MG TABLET    Take 2 tablets by mouth daily with breakfast.   EZETIMIBE  (ZETIA ) 10 MG TABLET    TAKE 1 TABLET BY MOUTH EVERY DAY   LEVOTHYROXINE  (SYNTHROID ) 125 MCG TABLET    Take 1 tablet (125  mcg total) by mouth daily.   METFORMIN  (GLUCOPHAGE -XR) 500 MG 24 HR TABLET    Take 1 tablet (500 mg total) by mouth 2 (two) times daily.   NEBIVOLOL  (BYSTOLIC ) 10 MG TABLET    Take 1 tablet (10 mg total) by mouth daily.   OMEGA-3 ACID ETHYL ESTERS (LOVAZA ) 1 G CAPSULE    TAKE 2 CAPSULES BY MOUTH 2 TIMES DAILY.   SIMVASTATIN  (ZOCOR ) 20 MG TABLET    TAKE 1 TABLET BY MOUTH EVERY DAY   VITAMIN D , ERGOCALCIFEROL , (DRISDOL ) 50000 UNITS CAPS CAPSULE    Take 50,000 Units by mouth once a week. Thursday  Modified Medications   No medications on file  Discontinued Medications   No medications on file    Past Medical History Past Medical History:  Diagnosis Date   Abnormal stress test    Arthritis    Atrial fibrillation (HCC)    BMI 28.0-28.9,adult    Chest pain on exertion 2015   Heart murmur    Hyperlipidemia    Hypertension    Hypothyroidism    half of thyroid  removed d/t large goiter   Impaired fasting glucose    Osteoporosis    Palpitations    Pleuritic pain    right lung base   PONV (postoperative nausea and vomiting)    SOB (shortness of  breath) on exertion 2015   TMJ (dislocation of temporomandibular joint)    not as bad as it was; very minor now (02/11/2016)   Type II diabetes mellitus (HCC)    they say I'm prediabetic but I'm on RX (02/11/2016)    Past Surgical History Past Surgical History:  Procedure Laterality Date   ABDOMINAL HYSTERECTOMY  11/27/2004   Laparoscopic supracervical hysterectomy, not due to cancer   BACK SURGERY     CERVICAL DISCECTOMY  2000s   CESAREAN SECTION  1993; 1998   COLONOSCOPY     ELECTROPHYSIOLOGIC STUDY N/A 02/11/2016   Procedure: A-Fluttter Ablation;  Surgeon: Danelle LELON Birmingham, MD;  Location: [facility] INVASIVE CV LAB;  Service: Cardiovascular;  Laterality: N/A;   LEFT HEART CATHETERIZATION WITH CORONARY ANGIOGRAM N/A 06/12/2014   Procedure: LEFT HEART CATHETERIZATION WITH CORONARY ANGIOGRAM;  Surgeon: Victory LELON Claudene DOUGLAS, MD;  Location: [facility] CATH  LAB;  Service: Cardiovascular;  Laterality: N/A;   THYROIDECTOMY, PARTIAL Left    pre-cancerous cells    Family History family history includes Colon polyps in her father; Diabetes Mellitus I in her mother; Heart attack in her paternal uncle; Heart attack (age of onset: 33) in her cousin; Heart attack (age of onset: 59) in her paternal grandfather; Hyperlipidemia in her father; Hypertension in her father; Stroke (age of onset: 38) in her paternal aunt.  Social History Social History   Socioeconomic History   Marital status: Married    Spouse name: Not on file   Number of children: Not on file   Years of education: Not on file   Highest education level: Not on file  Occupational History   Not on file  Tobacco Use   Smoking status: Never   Smokeless tobacco: Never  Vaping Use   Vaping status: Never Used  Substance and Sexual Activity   Alcohol use: No    Alcohol/week: 0.0 standard drinks of alcohol   Drug use: No   Sexual activity: Yes    Birth control/protection: None, Surgical  Other Topics Concern   Not on file  Social History Narrative   Not on file   Social Drivers of Health   Financial Resource Strain: Low Risk  (07/21/2023)   Overall Financial Resource Strain (CARDIA)    Difficulty of Paying Living Expenses: Not hard at all  Food Insecurity: No Food Insecurity (07/21/2023)   Hunger Vital Sign    Worried About Running Out of Food in the Last Year: Never true    Ran Out of Food in the Last Year: Never true  Transportation Needs: No Transportation Needs (07/21/2023)   PRAPARE - Administrator, Civil Service (Medical): No    Lack of Transportation (Non-Medical): No  Physical Activity: Inactive (07/21/2023)   Exercise Vital Sign    Days of Exercise per Week: 0 days    Minutes of Exercise per Session: 0 min  Stress: No Stress Concern Present (07/21/2023)   [employer] of Occupational Health - Occupational Stress Questionnaire    Feeling of Stress : Not at  all  Social Connections: Moderately Integrated (07/21/2023)   Social Connection and Isolation Panel    Frequency of Communication with Friends and Family: More than three times a week    Frequency of Social Gatherings with Friends and Family: More than three times a week    Attends Religious Services: More than 4 times per year    Active Member of [employer] or Organizations: No    Attends Banker Meetings:  Never    Marital Status: Married  Catering manager Violence: Not At Risk (07/21/2023)   Humiliation, Afraid, Rape, and Kick questionnaire    Fear of Current or Ex-Partner: No    Emotionally Abused: No    Physically Abused: No    Sexually Abused: No    Laboratory Investigations Lab Results  Component Value Date   TSH 1.41 01/27/2024   TSH 0.68 09/09/2023   TSH 0.076 (L) 07/21/2023   FREET4 1.6 01/27/2024   FREET4 1.9 (H) 09/09/2023   FREET4 2.32 (H) 07/21/2023     No results found for: TSI   No components found for: TRAB   Lab Results  Component Value Date   CHOL 129 10/10/2023   Lab Results  Component Value Date   HDL 53 10/10/2023   Lab Results  Component Value Date   LDLCALC 49 10/10/2023   Lab Results  Component Value Date   TRIG 164 (H) 10/10/2023   Lab Results  Component Value Date   CHOLHDL 2.4 10/10/2023   Lab Results  Component Value Date   CREATININE 0.74 10/10/2023   No results found for: GFR    Component Value Date/Time   NA 139 10/10/2023 0742   K 4.5 10/10/2023 0742   CL 101 10/10/2023 0742   CO2 22 10/10/2023 0742   GLUCOSE 116 (H) 10/10/2023 0742   GLUCOSE 106 (H) 01/30/2016 0735   BUN 18 10/10/2023 0742   CREATININE 0.74 10/10/2023 0742   CREATININE 0.81 01/30/2016 0735   CALCIUM  9.6 10/10/2023 0742   PROT 6.7 10/10/2023 0742   ALBUMIN 4.4 10/10/2023 0742   AST 15 10/10/2023 0742   ALT 19 10/10/2023 0742   ALKPHOS 76 10/10/2023 0742   BILITOT 0.6 10/10/2023 0742   GFRNONAA >60 01/11/2016 0150   GFRAA >60 01/11/2016  0150      Latest Ref Rng & Units 10/10/2023    7:42 AM 08/12/2023    7:49 AM 08/08/2023    7:42 AM  BMP  Glucose 70 - 99 mg/dL 883  891  872   BUN 8 - 27 mg/dL 18  12  14    Creatinine 0.57 - 1.00 mg/dL 9.25  9.24  9.10   BUN/Creat Ratio 12 - 28 24  16  16    Sodium 134 - 144 mmol/L 139  139  142   Potassium 3.5 - 5.2 mmol/L 4.5  4.8  5.7   Chloride 96 - 106 mmol/L 101  101  101   CO2 20 - 29 mmol/L 22  26  21    Calcium  8.7 - 10.3 mg/dL 9.6  9.1  89.4        Component Value Date/Time   WBC 5.6 10/10/2023 0742   WBC 5.9 01/30/2016 0735   RBC 4.96 10/10/2023 0742   RBC 4.86 01/30/2016 0735   HGB 14.9 10/10/2023 0742   HCT 45.0 10/10/2023 0742   PLT 247 10/10/2023 0742   MCV 91 10/10/2023 0742   MCH 30.0 10/10/2023 0742   MCH 30.0 01/30/2016 0735   MCHC 33.1 10/10/2023 0742   MCHC 34.0 01/30/2016 0735   RDW 12.4 10/10/2023 0742   LYMPHSABS 1.6 10/10/2023 0742   MONOABS 413 01/30/2016 0735   EOSABS 0.1 10/10/2023 0742   BASOSABS 0.0 10/10/2023 0742      Parts of this note may have been dictated using voice recognition software. There may be variances in spelling and vocabulary which are unintentional. Not all errors are proofread. Please notify the dino if  any discrepancies are noted or if the meaning of any statement is not clear.

## 2024-02-06 ENCOUNTER — Other Ambulatory Visit: Payer: Self-pay | Admitting: Family Medicine

## 2024-03-05 ENCOUNTER — Other Ambulatory Visit (INDEPENDENT_AMBULATORY_CARE_PROVIDER_SITE_OTHER)

## 2024-03-05 ENCOUNTER — Telehealth: Payer: Self-pay | Admitting: "Endocrinology

## 2024-03-05 LAB — TSH+FREE T4: TSH W/REFLEX TO FT4: 1.5 m[IU]/L (ref 0.40–4.50)

## 2024-03-05 LAB — T3, FREE: T3, Free: 3 pg/mL (ref 2.3–4.2)

## 2024-03-05 NOTE — Telephone Encounter (Signed)
 Patient is scheduled for labs today, 03/05/2024 at 2:45 PM.

## 2024-03-05 NOTE — Telephone Encounter (Signed)
 Patient is calling to say that she would like to get an appointment for labs today.  Patient states that she is in AFIB and has an appointment with her cardiologist this Thursday, 03/08/2024, but in the meantime she would like labs done.  Patient states that last time this happened that her thyroid  functions were off.  Patient states that she will be in [place] this afternoon if labs are possible.

## 2024-03-08 ENCOUNTER — Ambulatory Visit: Attending: Cardiology | Admitting: Cardiology

## 2024-03-08 ENCOUNTER — Encounter: Payer: Self-pay | Admitting: Cardiology

## 2024-03-08 ENCOUNTER — Other Ambulatory Visit (HOSPITAL_COMMUNITY): Payer: Self-pay

## 2024-03-08 ENCOUNTER — Encounter: Payer: Self-pay | Admitting: Family Medicine

## 2024-03-08 VITALS — BP 170/90 | HR 57 | Ht 64.8 in | Wt 174.2 lb

## 2024-03-08 DIAGNOSIS — I1A0 Resistant hypertension: Secondary | ICD-10-CM | POA: Diagnosis not present

## 2024-03-08 DIAGNOSIS — R002 Palpitations: Secondary | ICD-10-CM | POA: Diagnosis not present

## 2024-03-08 DIAGNOSIS — R0683 Snoring: Secondary | ICD-10-CM

## 2024-03-08 DIAGNOSIS — Z8679 Personal history of other diseases of the circulatory system: Secondary | ICD-10-CM

## 2024-03-08 DIAGNOSIS — Z9889 Other specified postprocedural states: Secondary | ICD-10-CM | POA: Diagnosis not present

## 2024-03-08 DIAGNOSIS — E119 Type 2 diabetes mellitus without complications: Secondary | ICD-10-CM

## 2024-03-08 MED ORDER — SPIRONOLACTONE-HCTZ 25-25 MG PO TABS
1.0000 | ORAL_TABLET | ORAL | 2 refills | Status: DC
  Filled 2024-03-08: qty 30, 30d supply, fill #0

## 2024-03-08 NOTE — Patient Instructions (Signed)
 Medication Instructions:  Your physician recommends that you continue on your current medications as directed. Please refer to the Current Medication list given to you today.  *If you need a refill on your cardiac medications before your next appointment, please call your pharmacy*  Lab Work: Bmp in 2 weeks If you have labs (blood work) drawn today and your tests are completely normal, you will receive your results only by: MyChart Message (if you have MyChart) OR A paper copy in the mail If you have any lab test that is abnormal or we need to change your treatment, we will call you to review the results.  Testing/Procedures: Zio ZIO XT- Long Term Monitor Instructions  Your physician has requested you wear a ZIO patch monitor for 14 days.  This is a single patch monitor. [employer] supplies one patch monitor per enrollment. Additional stickers are not available. Please do not apply patch if you will be having a Nuclear Stress Test,  Echocardiogram, Cardiac CT, MRI, or Chest Xray during the period you would be wearing the  monitor. The patch cannot be worn during these tests. You cannot remove and re-apply the  ZIO XT patch monitor.  Your ZIO patch monitor will be mailed 3 day USPS to your address on file. It may take 3-5 days  to receive your monitor after you have been enrolled.  Once you have received your monitor, please review the enclosed instructions. Your monitor  has already been registered assigning a specific monitor serial # to you.  Billing and Patient Assistance Program Information  We have supplied [employer] with any of your insurance information on file for billing purposes. [employer] offers a sliding scale Patient Assistance Program for patients that do not have  insurance, or whose insurance does not completely cover the cost of the ZIO monitor.  You must apply for the Patient Assistance Program to qualify for this discounted rate.  To apply, please call [employer] at  [phone], select option 4, select option 2, ask to apply for  Patient Assistance Program. Meredeth will ask your household income, and how many people  are in your household. They will quote your out-of-pocket cost based on that information.  [employer] will also be able to set up a 70-month, interest-free payment plan if needed.  Applying the monitor   Shave hair from upper left chest.  Hold abrader disc by orange tab. Rub abrader in 40 strokes over the upper left chest as  indicated in your monitor instructions.  Clean area with 4 enclosed alcohol pads. Let dry.  Apply patch as indicated in monitor instructions. Patch will be placed under collarbone on left  side of chest with arrow pointing upward.  Rub patch adhesive wings for 2 minutes. Remove white label marked 1. Remove the white  label marked 2. Rub patch adhesive wings for 2 additional minutes.  While looking in a mirror, press and release button in center of patch. A small green light will  flash 3-4 times. This will be your only indicator that the monitor has been turned on.  Do not shower for the first 24 hours. You may shower after the first 24 hours.  Press the button if you feel a symptom. You will hear a small click. Record Date, Time and  Symptom in the Patient Logbook.  When you are ready to remove the patch, follow instructions on the last 2 pages of Patient  Logbook. Stick patch monitor onto the last page of Patient Logbook.  Place Patient  Logbook in the blue and white box. Use locking tab on box and tape box closed  securely. The blue and white box has prepaid postage on it. Please place it in the mailbox as  soon as possible. Your physician should have your test results approximately 7 days after the  monitor has been mailed back to [facility].  Call [facility] Customer Care at [phone] if you have questions regarding  your ZIO XT patch monitor. Call them immediately if you see an orange light  blinking on your  monitor.  If your monitor falls off in less than 4 days, contact our Monitor department at [phone].  If your monitor becomes loose or falls off after 4 days call [employer] at [phone] for  suggestions on securing your monitor   Follow-Up: At [facility], you and your health needs are our priority.  As part of our continuing mission to provide you with exceptional heart care, our providers are all part of one team.  This team includes your primary Cardiologist (physician) and Advanced Practice Providers or APPs (Physician Assistants and Nurse Practitioners) who all work together to provide you with the care you need, when you need it.  Your next appointment:   1 month  Provider:   Dr. Ladona  We recommend signing up for the patient portal called MyChart.  Sign up information is provided on this After Visit Summary.  MyChart is used to connect with patients for Virtual Visits (Telemedicine).  Patients are able to view lab/test results, encounter notes, upcoming appointments, etc.  Non-urgent messages can be sent to your provider as well.   To learn more about what you can do with MyChart, go to [email or web address].   Other Instructions WatchPAT?  Is a FDA cleared portable home sleep study test that uses a watch and 3 points of contact to monitor 7 different channels, including your heart rate, oxygen saturations, body position, snoring, and chest motion.  The study is easy to use from the comfort of your own home and accurately detect sleep apnea.  Before bed, you attach the chest sensor, attached the sleep apnea bracelet to your nondominant hand, and attach the finger probe.  After the study, the raw data is downloaded from the watch and scored for apnea events.   For more information: [email or web address]  Patient Testing Instructions:  Do not put battery into the device until bedtime when you are ready to begin the test. Please  call the support number if you need assistance after following the instructions below: 24 hour support line- [phone] or [facility] support at [phone] (option 2)  Download the [facility] WatchPAT One app through the google play store or App Store  Be sure to turn on or enable access to bluetooth in settlings on your smartphone/ device  Make sure no other bluetooth devices are on and within the vicinity of your smartphone/ device and WatchPAT watch during testing.  Make sure to leave your smart phone/ device plugged in and charging all night.  When ready for bed:  Follow the instructions step by step in the WatchPAT One App to activate the testing device. For additional instructions, including video instruction, visit the WatchPAT One video on Youtube. You can search for WatchPat One within Youtube (video is 4 minutes and 18 seconds) or enter: https://youtube/watch?[email or web address]_vbiwxE Please note: You will be prompted to enter a Pin to connect via bluetooth when starting the test. The PIN will be assigned to you when you receive the test.  The  device is disposable, but it recommended that you retain the device until you receive a call letting you know the study has been received and the results have been interpreted.  We will let you know if the study did not transmit to us  properly after the test is completed. You do not need to call us  to confirm the receipt of the test.  Please complete the test within 48 hours of receiving PIN.   Frequently Asked Questions:  What is Watch Bruna one?  A single use fully disposable home sleep apnea testing device and will not need to be returned after completion.  What are the requirements to use WatchPAT one?  The be able to have a successful watchpat one sleep study, you should have your Watch pat one device, your smart phone, watch pat one app, your PIN number and Internet access What type of phone do I need?  You should have a smart phone that uses Android 5.1 and  above or any Iphone with IOS 10 and above How can I download the WatchPAT one app?  Based on your device type search for WatchPAT one app either in google play for android devices or APP store for Iphone's Where will I get my PIN for the study?  Your PIN will be provided by your physician's office. It is used for authentication and if you lose/forget your PIN, please reach out to your providers office.  I do not have Internet at home. Can I do WatchPAT one study?  WatchPAT One needs Internet connection throughout the night to be able to transmit the sleep data. You can use your home/local internet or your cellular's data package. However, it is always recommended to use home/local Internet. It is estimated that between 20MB-30MB will be used with each study.However, the application will be looking for space in the phone to start the study.  What happens if I lose internet or bluetooth connection?  During the internet disconnection, your phone will not be able to transmit the sleep data. All the data, will be stored in your phone. As soon as the internet connection is back on, the phone will being sending the sleep data. During the bluetooth disconnection, WatchPAT one will not be able to to send the sleep data to your phone. Data will be kept in the WatchPAT one until two devices have bluetooth connection back on. As soon as the connection is back on, WatchPAT one will send the sleep data to the phone.  How long do I need to wear the WatchPAT one?  After you start the study, you should wear the device at least 6 hours.  How far should I keep my phone from the device?  During the night, your phone should be within 15 feet.  What happens if I leave the room for restroom or other reasons?  Leaving the room for any reason will not cause any problem. As soon as your get back to the room, both devices will reconnect and will continue to send the sleep data. Can I use my phone during the sleep study?   Yes, you can use your phone as usual during the study. But it is recommended to put your watchpat one on when you are ready to go to bed.  How will I get my study results?  A soon as you completed your study, your sleep data will be sent to the provider. They will then share the results with you when they are ready.  Your physician has requested that you have a renal artery duplex. During this test, an ultrasound is used to evaluate blood flow to the kidneys. Take your medications as you usually do. This will take place at [address] 4th floor.  No food after 11PM the night before.  Water is OK. (Don't drink liquids if you have been instructed not to for ANOTHER test). Avoid foods that produce bowel gas, for 24 hours prior to exam (see below). No breakfast, no chewing gum, no smoking or carbonated beverages. Patient may take morning medications with water. Come in for test at least 15 minutes early to register.  Please note: We ask at that you not bring children with you during ultrasound (echo/ vascular) testing. Due to room size and safety concerns, children are not allowed in the ultrasound rooms during exams. Our front office staff cannot provide observation of children in our lobby area while testing is being conducted. An adult accompanying a patient to their appointment will only be allowed in the ultrasound room at the discretion of the ultrasound technician under special circumstances. We apologize for any inconvenience.

## 2024-03-08 NOTE — Telephone Encounter (Signed)
 done

## 2024-03-08 NOTE — Progress Notes (Signed)
 Cardiology Office Note:  .   Date:  03/08/2024  ID:  Claudia Shelton, DOB 09-Jan-1961, MRN [id number] PCP: Sherre Clapper, MD  [facility] Providers Cardiologist:  None   History of Present Illness: .   Claudia Shelton is a 63 y.o. Caucasian female patient with hypertension, hypercholesterolemia, hyperglycemia, mild obesity and atrial flutter ablation on 02/11/2016 presents for 50-month visit for management of specifically hypertension.  She has had multiple medication intolerances for antihypertensive medications including HCTZ causing leg cramps, amlodipine  causing leg edema, losartan  causing hyperkalemia and elevated serum creatinine.  Patient presents stating that her blood pressure continues remain elevated.  She is also having more frequent episodes of palpitations and thinks that atrial flutter may be back, palpitations occurring especially at night.  She also thinks that she may be retaining fluids as she is continuing to gain weight.  She also endorses loud snoring, waking up tired, daytime somnolence.  Cardiac Studies relevent.    ECHOCARDIOGRAM COMPLETE 11/21/2020  1. Left ventricular ejection fraction, by estimation, is 60 to 65%. The left ventricle has normal function. The left ventricle has no regional wall motion abnormalities. There is mild concentric left ventricular hypertrophy. Left ventricular diastolic parameters are consistent with Grade I diastolic dysfunction (impaired relaxation). 2. Right ventricular systolic function is normal. The right ventricular size is normal. 3. Left atrial size was mildly dilated.    Discussed the use of AI scribe software for clinical note transcription with the patient, who gave verbal consent to proceed.  History of Present Illness Claudia Shelton is a 63 year old female with hypertension and atrial flutter who presents with uncontrolled blood pressure and palpitations.  Her blood pressure remains uncontrolled despite nebivolol  10 mg daily.  She has a history of high cholesterol and elevated triglycerides. Her dietary habits, primarily eating outside, may contribute to her condition.  She experiences palpitations, described as 'extra beats,' and is concerned about the recurrence of atrial fibrillation. She is scheduled to wear a monitor for two weeks to assess her heart rhythm.  Her medication history includes a previous trial of losartan , which led to decreased kidney function and increased potassium levels, raising concerns about possible renal artery blockages. Symptoms suggestive of sleep apnea include snoring, daytime somnolence, morning tiredness, and lack of energy.   Labs   Lab Results  Component Value Date   CHOL 129 10/10/2023   HDL 53 10/10/2023   LDLCALC 49 10/10/2023   TRIG 164 (H) 10/10/2023   CHOLHDL 2.4 10/10/2023   No results found for: LIPOA  Recent Labs    08/08/23 0742 08/12/23 0749 10/10/23 0742  NA 142 139 139  K 5.7* 4.8 4.5  CL 101 101 101  CO2 21 26 22   GLUCOSE 127* 108* 116*  BUN 14 12 18   CREATININE 0.89 0.75 0.74  CALCIUM  10.5* 9.1 9.6    Lab Results  Component Value Date   ALT 19 10/10/2023   AST 15 10/10/2023   ALKPHOS 76 10/10/2023   BILITOT 0.6 10/10/2023      Latest Ref Rng & Units 10/10/2023    7:42 AM 07/21/2023    2:14 PM 01/30/2016    7:35 AM  CBC  WBC 3.4 - 10.8 x10E3/uL 5.6  7.9  5.9   Hemoglobin 11.1 - 15.9 g/dL 85.0  84.7  85.3   Hematocrit 34.0 - 46.6 % 45.0  45.9  42.9   Platelets 150 - 450 x10E3/uL 247  303  242    Lab  Results  Component Value Date   HGBA1C 6.0 (H) 10/10/2023    Lab Results  Component Value Date   TSH 1.41 01/27/2024    ROS  Review of Systems  Cardiovascular:  Positive for palpitations. Negative for chest pain, dyspnea on exertion and leg swelling.   Physical Exam:   VS:  BP (!) 170/90 (BP Location: Right Arm, Patient Position: Sitting, Cuff Size: Normal)   Pulse (!) 57   Ht 5' 4.8 (1.646 m)   Wt 174 lb 3.2 oz (79 kg)   SpO2 97%    BMI 29.17 kg/m    Wt Readings from Last 3 Encounters:  03/08/24 174 lb 3.2 oz (79 kg)  02/02/24 170 lb (77.1 kg)  12/09/23 170 lb (77.1 kg)    BP Readings from Last 3 Encounters:  03/08/24 (!) 170/90  12/09/23 129/79  10/19/23 130/70   Physical Exam Constitutional:      Appearance: She is obese.  Neck:     Vascular: No carotid bruit or JVD.  Cardiovascular:     Rate and Rhythm: Normal rate and regular rhythm.     Pulses: Intact distal pulses.     Heart sounds: Normal heart sounds. No murmur heard.    No gallop.  Pulmonary:     Effort: Pulmonary effort is normal.     Breath sounds: Normal breath sounds.  Abdominal:     General: Bowel sounds are normal.     Palpations: Abdomen is soft.  Musculoskeletal:     Right lower leg: No edema.     Left lower leg: No edema.    EKG:    EKG Interpretation Date/Time:  Thursday March 08 2024 08:04:06 EDT Ventricular Rate:  57 PR Interval:  160 QRS Duration:  82 QT Interval:  412 QTC Calculation: 401 R Axis:   28  Text Interpretation: EKG 03/08/2024: Normal sinus rhythm at rate of 57 bpm, normal axis.  Poor R progression, probably normal variant.  No evidence of ischemia.  Compared to 02/01/2023, no significant change. Confirmed by Graviela Nodal, Jagadeesh ([id number]) on 03/08/2024 8:09:06 AM    ASSESSMENT AND PLAN: .      ICD-10-CM   1. Resistant hypertension  I1A.0 spironolactone -hydrochlorothiazide  (ALDACTAZIDE ) 25-25 MG tablet    Basic Metabolic Panel (BMET)    VAS US  RENAL ARTERY DUPLEX    2. S/P ablation of atrial flutter 2017  Z98.890 LONG TERM MONITOR (3-14 DAYS)   Z86.79     3. Palpitations  R00.2 EKG 12-Lead    [facility] Sleep Study    4. Loud snoring  R06.83 [facility] Sleep Study    5. Diabetes mellitus without complication (HCC)  E11.9      Assessment & Plan Resistant hypertension Hypertension remains poorly controlled despite current medication regimen. Current medication includes nebivolol  10 mg daily. Due to obesity  and potential renal artery blockages, there is a concern for secondary causes of hypertension. - Add aldactazide  (spironolactone /hydrochlorothiazide ) 25/25 mg once daily in the morning.  Although by definition she does not qualify for resistant hypertension as she is only on 1 medication, Bystolic  10 mg daily, she has multiple medication intolerances. - Check BMP, kidney function, and potassium levels in two weeks - Order renal duplex to evaluate for renal artery stenosis  Palpitations and atrial flutter Reports of increased palpitations and concern for recurrence of atrial flutter. Due to obesity, suspected sleep apnea, and uncontrolled hypertension, there is an increased risk for atrial fibrillation. - Prescribe a cardiac monitor for two weeks to assess  for atrial flutter or atrial fibrillation  Suspected obstructive sleep apnea Reports of snoring, daytime somnolence, and lack of energy suggestive of obstructive sleep apnea. Obesity is a contributing risk factor. - Order sleep study to evaluate for obstructive sleep apnea  Obesity Obesity is contributing to hypertension, palpitations, and suspected sleep apnea. Current dietary habits include frequent eating out, which may contribute to weight gain and poor control of blood pressure and cholesterol. - Encourage dietary changes to include more home-cooked meals to aid in weight loss and management of blood pressure and cholesterol - Discussed with Dr. Josette Free, her PCP and I think in view of her obesity, sleep apnea, she would be an excellent candidate for GLP-1 agonist and patient will go to our office to pick up samples first.  Hyperlipidemia and hypertriglyceridemia Hyperlipidemia and hypertriglyceridemia are present, likely exacerbated by dietary habits and obesity. Dietary changes are anticipated to improve lipid profile. - Encourage dietary changes to include more home-cooked meals to aid in management of cholesterol and triglycerides.   LDL is at goal however triglycerides remain elevated.  Presently on simvastatin  20 mg in the evening and Lovaza  2 g twice daily.   Follow up: 1 month.  Can be seen by app. Total time spent in evaluation of prior records as she is new to me, medication changes, coordination of care was 40 minutes.  Signed,  Gordy Bergamo, MD, [facility] 03/08/2024, 6:03 PM [facility] [address] [place], [place] [address] Phone: [phone]. Fax:  [phone]

## 2024-03-16 ENCOUNTER — Other Ambulatory Visit: Payer: Self-pay | Admitting: Family Medicine

## 2024-03-23 ENCOUNTER — Encounter: Payer: Self-pay | Admitting: Cardiology

## 2024-03-23 ENCOUNTER — Other Ambulatory Visit: Payer: Self-pay | Admitting: Cardiology

## 2024-03-23 ENCOUNTER — Ambulatory Visit (HOSPITAL_COMMUNITY)
Admission: RE | Admit: 2024-03-23 | Discharge: 2024-03-23 | Disposition: A | Discharge status: Home / Self Care | Source: Ambulatory Visit | Attending: Cardiology | Admitting: Cardiology

## 2024-03-23 DIAGNOSIS — R002 Palpitations: Secondary | ICD-10-CM

## 2024-03-23 DIAGNOSIS — I1A0 Resistant hypertension: Secondary | ICD-10-CM

## 2024-03-23 DIAGNOSIS — Z9889 Other specified postprocedural states: Secondary | ICD-10-CM

## 2024-03-23 LAB — BASIC METABOLIC PANEL WITH GFR
BUN/Creatinine Ratio: 14 (ref 12–28)
BUN: 13 mg/dL (ref 8–27)
CO2: 23 mmol/L (ref 20–29)
Calcium: 10.1 mg/dL (ref 8.7–10.3)
Chloride: 100 mmol/L (ref 96–106)
Creatinine, Ser: 0.91 mg/dL (ref 0.57–1.00)
Glucose: 110 mg/dL — ABNORMAL HIGH (ref 70–99)
Potassium: 5.5 mmol/L — ABNORMAL HIGH (ref 3.5–5.2)
Sodium: 140 mmol/L (ref 134–144)
eGFR: 70 mL/min/1.73 (ref >59)

## 2024-03-23 MED ORDER — METOPROLOL TARTRATE 50 MG PO TABS: 50.0000 mg | ORAL_TABLET | Freq: Two times a day (BID) | ORAL | 2 refills | Status: DC

## 2024-03-23 NOTE — Telephone Encounter (Signed)
 ICD-10-CM   1. Resistant hypertension  I1A.0 metoprolol  tartrate (LOPRESSOR ) 50 MG tablet     No orders of the defined types were placed in this encounter.   Meds ordered this encounter  Medications   metoprolol  tartrate (LOPRESSOR ) 50 MG tablet    Sig: Take 1 tablet (50 mg total) by mouth 2 (two) times daily.    Dispense:  60 tablet    Refill:  2    Discontinue Nebivolol 

## 2024-03-25 ENCOUNTER — Ambulatory Visit: Payer: Self-pay | Admitting: Cardiology

## 2024-03-25 NOTE — Progress Notes (Signed)
 Normal renal artery duplex. No blockage seen  Renal duplex 03/23/2024  No RAS.  Abnormal right Resistive Index. Cortical mid simple cyst measuring 1.4 x 1.1 x 1.2 cm lef kidney.

## 2024-03-26 ENCOUNTER — Ambulatory Visit: Payer: Self-pay | Admitting: Cardiology

## 2024-03-26 DIAGNOSIS — I1A0 Resistant hypertension: Secondary | ICD-10-CM

## 2024-03-26 NOTE — Progress Notes (Signed)
 Your potassium level is up due to Aldactazide . Please avoid fruit juices and also avoid nuts for now. Will recheck in 2-3 weeks (BMP) and if still high will have to cut your medication in 1/2 or add extra hydrochlorothiazide .  Please go to [facility] in 2 weeks and no fasting needed

## 2024-04-03 ENCOUNTER — Ambulatory Visit: Attending: Cardiology

## 2024-04-03 DIAGNOSIS — Z9889 Other specified postprocedural states: Secondary | ICD-10-CM

## 2024-04-03 DIAGNOSIS — R002 Palpitations: Secondary | ICD-10-CM

## 2024-04-03 NOTE — Addendum Note (Signed)
 Addended by: LORRENE FEDERICO CROME on: 04/03/2024 12:27 PM   Modules accepted: Orders

## 2024-04-03 NOTE — Progress Notes (Unsigned)
 Enrolled patient for a 14 day Zio XT  monitor to be mailed to patients home

## 2024-04-11 ENCOUNTER — Telehealth: Payer: Self-pay | Admitting: Family Medicine

## 2024-04-11 NOTE — Telephone Encounter (Signed)
 Message sent via [employer]

## 2024-04-11 NOTE — Telephone Encounter (Unsigned)
 Copied from CRM [id number]. Topic: Clinical - Medication Question >> Apr 11, 2024  8:37 AM Tiffini S wrote:  Reason for CRM: Patient called stating that she was prescribed Mounjaro and will take in January once she has retired from work- wants to know if the appointment scheduled for 04/23/24 and 04/25/24 is a follow up for the medication- need more details about what the appointment is for before cancelling or rescheduling- advise 6 month chronic follow up  Please call the patient back at [phone]

## 2024-04-19 ENCOUNTER — Ambulatory Visit: Admitting: Cardiology

## 2024-04-21 ENCOUNTER — Other Ambulatory Visit: Payer: Self-pay | Admitting: Family Medicine

## 2024-04-21 ENCOUNTER — Other Ambulatory Visit: Payer: Self-pay

## 2024-04-21 DIAGNOSIS — I10 Essential (primary) hypertension: Secondary | ICD-10-CM

## 2024-04-21 DIAGNOSIS — E782 Mixed hyperlipidemia: Secondary | ICD-10-CM

## 2024-04-21 DIAGNOSIS — E119 Type 2 diabetes mellitus without complications: Secondary | ICD-10-CM

## 2024-04-22 ENCOUNTER — Other Ambulatory Visit: Payer: Self-pay | Admitting: Family Medicine

## 2024-04-23 ENCOUNTER — Other Ambulatory Visit: Payer: Self-pay

## 2024-04-23 ENCOUNTER — Other Ambulatory Visit

## 2024-04-23 DIAGNOSIS — E782 Mixed hyperlipidemia: Secondary | ICD-10-CM

## 2024-04-23 DIAGNOSIS — E119 Type 2 diabetes mellitus without complications: Secondary | ICD-10-CM

## 2024-04-23 DIAGNOSIS — I10 Essential (primary) hypertension: Secondary | ICD-10-CM

## 2024-04-23 LAB — HEMOGLOBIN A1C
Est. average glucose Bld gHb Est-mCnc: 126 mg/dL
Hgb A1c MFr Bld: 6 % — ABNORMAL HIGH (ref 4.8–5.6)

## 2024-04-23 LAB — CBC WITH DIFFERENTIAL/PLATELET
Basophils Absolute: 0 x10E3/uL (ref 0.0–0.2)
Basos: 1 %
EOS (ABSOLUTE): 0.2 x10E3/uL (ref 0.0–0.4)
Eos: 4 %
Hematocrit: 45.5 % (ref 34.0–46.6)
Hemoglobin: 14.8 g/dL (ref 11.1–15.9)
Immature Grans (Abs): 0 x10E3/uL (ref 0.0–0.1)
Immature Granulocytes: 0 %
Lymphocytes Absolute: 1.4 x10E3/uL (ref 0.7–3.1)
Lymphs: 29 %
MCH: 29.8 pg (ref 26.6–33.0)
MCHC: 32.5 g/dL (ref 31.5–35.7)
MCV: 92 fL (ref 79–97)
Monocytes Absolute: 0.4 x10E3/uL (ref 0.1–0.9)
Monocytes: 8 %
Neutrophils Absolute: 2.9 x10E3/uL (ref 1.4–7.0)
Neutrophils: 58 %
Platelets: 262 x10E3/uL (ref 150–450)
RBC: 4.96 x10E6/uL (ref 3.77–5.28)
RDW: 12.5 % (ref 11.7–15.4)
WBC: 4.8 x10E3/uL (ref 3.4–10.8)

## 2024-04-23 LAB — LIPID PANEL
Chol/HDL Ratio: 2.6 ratio (ref 0.0–4.4)
Cholesterol, Total: 124 mg/dL (ref 100–199)
HDL: 48 mg/dL (ref >39)
LDL Chol Calc (NIH): 48 mg/dL (ref 0–99)
Triglycerides: 171 mg/dL — ABNORMAL HIGH (ref 0–149)
VLDL Cholesterol Cal: 28 mg/dL (ref 5–40)

## 2024-04-23 LAB — COMPREHENSIVE METABOLIC PANEL WITH GFR
ALT: 18 IU/L (ref 0–32)
AST: 23 IU/L (ref 0–40)
Albumin: 4.6 g/dL (ref 3.9–4.9)
Alkaline Phosphatase: 72 IU/L (ref 49–135)
BUN/Creatinine Ratio: 15 (ref 12–28)
BUN: 12 mg/dL (ref 8–27)
Bilirubin Total: 0.7 mg/dL (ref 0.0–1.2)
CO2: 22 mmol/L (ref 20–29)
Calcium: 9.6 mg/dL (ref 8.7–10.3)
Chloride: 98 mmol/L (ref 96–106)
Creatinine, Ser: 0.82 mg/dL (ref 0.57–1.00)
Globulin, Total: 2.4 g/dL (ref 1.5–4.5)
Glucose: 117 mg/dL — ABNORMAL HIGH (ref 70–99)
Potassium: 4.2 mmol/L (ref 3.5–5.2)
Sodium: 136 mmol/L (ref 134–144)
Total Protein: 7 g/dL (ref 6.0–8.5)
eGFR: 80 mL/min/1.73 (ref >59)

## 2024-04-25 ENCOUNTER — Encounter: Payer: Self-pay | Admitting: Family Medicine

## 2024-04-25 ENCOUNTER — Ambulatory Visit: Payer: Self-pay | Admitting: Gastroenterology

## 2024-04-25 ENCOUNTER — Ambulatory Visit: Payer: Self-pay | Admitting: Family Medicine

## 2024-04-25 ENCOUNTER — Ambulatory Visit: Admitting: Family Medicine

## 2024-04-25 VITALS — BP 148/78 | HR 65 | Temp 98.3°F | Ht 64.8 in | Wt 173.6 lb

## 2024-04-25 DIAGNOSIS — Z889 Allergy status to unspecified drugs, medicaments and biological substances status: Secondary | ICD-10-CM

## 2024-04-25 DIAGNOSIS — E039 Hypothyroidism, unspecified: Secondary | ICD-10-CM

## 2024-04-25 DIAGNOSIS — Z9189 Other specified personal risk factors, not elsewhere classified: Secondary | ICD-10-CM

## 2024-04-25 DIAGNOSIS — E782 Mixed hyperlipidemia: Secondary | ICD-10-CM

## 2024-04-25 DIAGNOSIS — R29818 Other symptoms and signs involving the nervous system: Secondary | ICD-10-CM

## 2024-04-25 DIAGNOSIS — E119 Type 2 diabetes mellitus without complications: Secondary | ICD-10-CM | POA: Diagnosis not present

## 2024-04-25 DIAGNOSIS — I10 Essential (primary) hypertension: Secondary | ICD-10-CM | POA: Diagnosis not present

## 2024-04-25 DIAGNOSIS — M810 Age-related osteoporosis without current pathological fracture: Secondary | ICD-10-CM

## 2024-04-25 MED ORDER — AZITHROMYCIN 250 MG PO TABS: ORAL_TABLET | ORAL | 0 refills | Status: DC

## 2024-04-25 NOTE — Assessment & Plan Note (Addendum)
 Management per specialist,  Dr. Vertell Gory

## 2024-04-25 NOTE — Progress Notes (Signed)
 Subjective:  Patient ID: Claudia Shelton, female    DOB: November 23, 1960  Age: 63 y.o. MRN: [id number]  Chief Complaint  Patient presents with   Medical Management of Chronic Issues    HPI: Discussed the use of AI scribe software for clinical note transcription with the patient, who gave verbal consent to proceed.  Glycemic control - Type 2 diabetes managed with metformin  extended release 500 mg twice daily. - Hemoglobin A1c recently measured at 6. - Has not started Mounjaro; plans to initiate after retirement in January.  Hypertension management and medication tolerance - Currently taking metoprolol ; minimal side effects reported. - Home blood pressure readings typically around 150/70-80 mmHg. - Previous antihypertensive medications caused adverse effects: - Spironolactone /hydrochlorothiazide : nausea and vomiting. - Losartan : hyperkalemia and urinary retention. - Amlodipine : headache and lower extremity edema. - Hydrochlorothiazide  alone: leg cramps.  Hyperlipidemia management - Managed with Lovaza  1 gram two capsules twice daily, Zetia  10 mg daily, and simvastatin  20 mg daily. - Triglycerides mildly elevated but have improved over the past nine months.  Osteoporosis and osteopenia - Takes alendronate  70 mg once weekly for osteoporosis. - Uses Oscal daily for osteopenia.  Thyroid  dysfunction - Thyroid  disorder managed with levothyroxine  125 mcg daily. - Thyroid  function stable for approximately six months following dose adjustments.  Cardiac arrhythmia and palpitations - Experiences heart palpitations described as 'a spasm and then a big beat' for several years. - History of ablation procedure for arrhythmia. - Recently wore a heart monitor for two weeks; results pending.  Sleep apnea evaluation - Awaiting results of a sleep apnea study.  Ophthalmologic symptoms - Annual ophthalmology evaluation. - New onset of visual floaters, prompting increased frequency of visits to  every six months.  General symptoms - No fever, chills, sweats, earache, sore throat, nasal congestion, chest pain, dyspnea, gastrointestinal symptoms, urinary symptoms, dizziness, or headache.      04/25/2024    2:35 PM 10/19/2023    1:23 PM 07/21/2023    1:28 PM 11/11/2022    9:16 AM 09/02/2021    3:10 PM  Depression screen PHQ 2/9  Decreased Interest 0 0 0 0 0  Down, Depressed, Hopeless 0 0 0 0 0  PHQ - 2 Score 0 0 0 0 0  Altered sleeping  0 0 0   Tired, decreased energy  0 0 0   Change in appetite  0 0 0   Feeling bad or failure about yourself   0 0 0   Trouble concentrating  0 0 0   Moving slowly or fidgety/restless  0 0 0   Suicidal thoughts  0 0 0   PHQ-9 Score  0  0  0    Difficult doing work/chores  Not difficult at all Not difficult at all Not difficult at all      Data saved with a previous flowsheet row definition        04/25/2024    2:35 PM  Fall Risk   Falls in the past year? 0  Number falls in past yr: 0  Injury with Fall? 0  Risk for fall due to : No Fall Risks  Follow up Falls evaluation completed    Patient Care Team: Sherre Clapper, MD as PCP - General (Family Medicine) Dartha Ernst, MD as Consulting Physician (Endocrinology) Nahser, Aleene PARAS, MD (Inactive) as Consulting Physician (Cardiology) Waddell Danelle ORN, MD as Consulting Physician (Cardiology)   Review of Systems  Constitutional:  Negative for appetite change, fatigue and fever.  HENT:  Negative for congestion, ear pain, sinus pressure and sore throat.   Respiratory:  Negative for cough, chest tightness, shortness of breath and wheezing.   Cardiovascular:  Negative for chest pain and palpitations.  Gastrointestinal:  Negative for abdominal pain, constipation, diarrhea, nausea and vomiting.  Genitourinary:  Negative for dysuria and hematuria.  Musculoskeletal:  Negative for arthralgias, back pain, joint swelling and myalgias.  Skin:  Negative for rash.  Neurological:  Negative for dizziness,  weakness and headaches.  Psychiatric/Behavioral:  Negative for dysphoric mood. The patient is not nervous/anxious.     Current Outpatient Medications on File Prior to Visit  Medication Sig Dispense Refill   alendronate  (FOSAMAX ) 70 MG tablet TAKE 1 TABLET (70 MG TOTAL) BY MOUTH ONCE A WEEK 12 tablet 1   calcium  carbonate (OS-CAL - DOSED IN MG OF ELEMENTAL CALCIUM ) 1250 (500 Ca) MG tablet Take 2 tablets by mouth daily with breakfast.     ezetimibe  (ZETIA ) 10 MG tablet TAKE 1 TABLET BY MOUTH EVERY DAY 90 tablet 0   levothyroxine  (SYNTHROID ) 125 MCG tablet Take 1 tablet (125 mcg total) by mouth daily. 90 tablet 3   metFORMIN  (GLUCOPHAGE -XR) 500 MG 24 hr tablet TAKE 1 TABLET BY MOUTH TWICE A DAY 180 tablet 1   metoprolol  tartrate (LOPRESSOR ) 50 MG tablet Take 1 tablet (50 mg total) by mouth 2 (two) times daily. 60 tablet 2   omega-3 acid ethyl esters (LOVAZA ) 1 g capsule TAKE 2 CAPSULES BY MOUTH 2 TIMES DAILY. 360 capsule 0   simvastatin  (ZOCOR ) 20 MG tablet TAKE 1 TABLET BY MOUTH EVERY DAY 90 tablet 0   No current facility-administered medications on file prior to visit.   Past Medical History:  Diagnosis Date   Abnormal stress test    Arthritis    Atrial fibrillation (HCC)    BMI 28.0-28.9,adult    Chest pain on exertion 2015   Heart murmur    Hyperlipidemia    Hypertension    Hypothyroidism    half of thyroid  removed d/t large goiter   Impaired fasting glucose    Osteoporosis    Palpitations    Pleuritic pain    right lung base   PONV (postoperative nausea and vomiting)    SOB (shortness of breath) on exertion 2015   TMJ (dislocation of temporomandibular joint)    not as bad as it was; very minor now (02/11/2016)   Type II diabetes mellitus (HCC)    they say I'm prediabetic but I'm on RX (02/11/2016)   Past Surgical History:  Procedure Laterality Date   ABDOMINAL HYSTERECTOMY  11/27/2004   Laparoscopic supracervical hysterectomy, not due to cancer   BACK SURGERY      CERVICAL DISCECTOMY  2000s   CESAREAN SECTION  1993; 1998   COLONOSCOPY     ELECTROPHYSIOLOGIC STUDY N/A 02/11/2016   Procedure: A-Fluttter Ablation;  Surgeon: Danelle LELON Birmingham, MD;  Location: [facility] INVASIVE [facility];  Service: Cardiovascular;  Laterality: N/A;   LEFT HEART CATHETERIZATION WITH CORONARY ANGIOGRAM N/A 06/12/2014   Procedure: LEFT HEART CATHETERIZATION WITH CORONARY ANGIOGRAM;  Surgeon: Victory LELON Claudene DOUGLAS, MD;  Location: [facility] CATH LAB;  Service: Cardiovascular;  Laterality: N/A;   THYROIDECTOMY, PARTIAL Left    pre-cancerous cells    Family History  Problem Relation Age of Onset   Diabetes Mellitus I Mother    Hypertension Father    Hyperlipidemia Father    Colon polyps Father    Heart attack Paternal Grandfather 53   Stroke Paternal Aunt  60   Heart attack Paternal Uncle    Heart attack Cousin 45   Colon cancer Neg Hx    Rectal cancer Neg Hx    Esophageal cancer Neg Hx    Stomach cancer Neg Hx    Social History   Socioeconomic History   Marital status: Married    Spouse name: Not on file   Number of children: Not on file   Years of education: Not on file   Highest education level: Not on file  Occupational History   Not on file  Tobacco Use   Smoking status: Never   Smokeless tobacco: Never  Vaping Use   Vaping status: Never Used  Substance and Sexual Activity   Alcohol use: No    Alcohol/week: 0.0 standard drinks of alcohol   Drug use: No   Sexual activity: Yes    Birth control/protection: None, Surgical  Other Topics Concern   Not on file  Social History Narrative   Not on file   Social Drivers of Health   Financial Resource Strain: Low Risk  (07/21/2023)   Overall Financial Resource Strain (CARDIA)    Difficulty of Paying Living Expenses: Not hard at all  Food Insecurity: No Food Insecurity (07/21/2023)   Hunger Vital Sign    Worried About Running Out of Food in the Last Year: Never true    Ran Out of Food in the Last Year: Never true  Transportation  Needs: No Transportation Needs (07/21/2023)   PRAPARE - Administrator, Civil Service (Medical): No    Lack of Transportation (Non-Medical): No  Physical Activity: Inactive (07/21/2023)   Exercise Vital Sign    Days of Exercise per Week: 0 days    Minutes of Exercise per Session: 0 min  Stress: No Stress Concern Present (07/21/2023)   [employer] of Occupational Health - Occupational Stress Questionnaire    Feeling of Stress : Not at all  Social Connections: Moderately Integrated (07/21/2023)   Social Connection and Isolation Panel    Frequency of Communication with Friends and Family: More than three times a week    Frequency of Social Gatherings with Friends and Family: More than three times a week    Attends Religious Services: More than 4 times per year    Active Member of [employer] or Organizations: No    Attends Engineer, Structural: Never    Marital Status: Married    Objective:  BP (!) 148/78 (BP Location: Left Arm, Patient Position: Sitting)   Pulse 65   Temp 98.3 F (36.8 C) (Temporal)   Ht 5' 4.8 (1.646 m)   Wt 173 lb 9.6 oz (78.7 kg)   SpO2 98%   BMI 29.07 kg/m      04/25/2024    2:34 PM 03/08/2024    7:59 AM 02/02/2024    2:21 PM  BP/Weight  Systolic BP 148 170   Diastolic BP 78 90   Wt. (Lbs) 173.6 174.2 170  BMI 29.07 kg/m2 29.17 kg/m2 29.18 kg/m2    Physical Exam Vitals reviewed.  Constitutional:      Appearance: Normal appearance. She is normal weight.  Neck:     Vascular: No carotid bruit.  Cardiovascular:     Rate and Rhythm: Normal rate and regular rhythm.     Pulses: Normal pulses.     Heart sounds: Normal heart sounds.  Pulmonary:     Effort: Pulmonary effort is normal. No respiratory distress.     Breath sounds: Normal  breath sounds.  Abdominal:     General: Abdomen is flat. Bowel sounds are normal.     Palpations: Abdomen is soft.     Tenderness: There is no abdominal tenderness.  Neurological:     Mental Status: She  is alert and oriented to person, place, and time.  Psychiatric:        Mood and Affect: Mood normal.        Behavior: Behavior normal.      Diabetic foot exam was performed with the following findings:   No deformities, ulcerations, or other skin breakdown Normal sensation of 10g monofilament Intact posterior tibialis and dorsalis pedis pulses      Lab Results  Component Value Date   WBC 4.8 04/23/2024   HGB 14.8 04/23/2024   HCT 45.5 04/23/2024   PLT 262 04/23/2024   GLUCOSE 117 (H) 04/23/2024   CHOL 124 04/23/2024   TRIG 171 (H) 04/23/2024   HDL 48 04/23/2024   LDLCALC 48 04/23/2024   ALT 18 04/23/2024   AST 23 04/23/2024   NA 136 04/23/2024   K 4.2 04/23/2024   CL 98 04/23/2024   CREATININE 0.82 04/23/2024   BUN 12 04/23/2024   CO2 22 04/23/2024   TSH 1.41 01/27/2024   INR 0.93 06/12/2014   HGBA1C 6.0 (H) 04/23/2024    Results for orders placed or performed in visit on 04/23/24  Lipid panel   Collection Time: 04/23/24  7:40 AM  Result Value Ref Range   Cholesterol, Total 124 100 - 199 mg/dL   Triglycerides 828 (H) 0 - 149 mg/dL   HDL 48 >60 mg/dL   VLDL Cholesterol Cal 28 5 - 40 mg/dL   LDL Chol Calc (NIH) 48 0 - 99 mg/dL   Chol/HDL Ratio 2.6 0.0 - 4.4 ratio  Hemoglobin A1c   Collection Time: 04/23/24  7:40 AM  Result Value Ref Range   Hgb A1c MFr Bld 6.0 (H) 4.8 - 5.6 %   Est. average glucose Bld gHb Est-mCnc 126 mg/dL  Comprehensive metabolic panel with GFR   Collection Time: 04/23/24  7:40 AM  Result Value Ref Range   Glucose 117 (H) 70 - 99 mg/dL   BUN 12 8 - 27 mg/dL   Creatinine, Ser 9.17 0.57 - 1.00 mg/dL   eGFR 80 >40 fO/fpw/8.26   BUN/Creatinine Ratio 15 12 - 28   Sodium 136 134 - 144 mmol/L   Potassium 4.2 3.5 - 5.2 mmol/L   Chloride 98 96 - 106 mmol/L   CO2 22 20 - 29 mmol/L   Calcium  9.6 8.7 - 10.3 mg/dL   Total Protein 7.0 6.0 - 8.5 g/dL   Albumin 4.6 3.9 - 4.9 g/dL   Globulin, Total 2.4 1.5 - 4.5 g/dL   Bilirubin Total 0.7 0.0 -  1.2 mg/dL   Alkaline Phosphatase 72 49 - 135 IU/L   AST 23 0 - 40 IU/L   ALT 18 0 - 32 IU/L  CBC with Differential/Platelet   Collection Time: 04/23/24  7:40 AM  Result Value Ref Range   WBC 4.8 3.4 - 10.8 x10E3/uL   RBC 4.96 3.77 - 5.28 x10E6/uL   Hemoglobin 14.8 11.1 - 15.9 g/dL   Hematocrit 54.4 65.9 - 46.6 %   MCV 92 79 - 97 fL   MCH 29.8 26.6 - 33.0 pg   MCHC 32.5 31.5 - 35.7 g/dL   RDW 87.4 88.2 - 84.5 %   Platelets 262 150 - 450 x10E3/uL   Neutrophils 58  Not Estab. %   Lymphs 29 Not Estab. %   Monocytes 8 Not Estab. %   Eos 4 Not Estab. %   Basos 1 Not Estab. %   Neutrophils Absolute 2.9 1.4 - 7.0 x10E3/uL   Lymphocytes Absolute 1.4 0.7 - 3.1 x10E3/uL   Monocytes Absolute 0.4 0.1 - 0.9 x10E3/uL   EOS (ABSOLUTE) 0.2 0.0 - 0.4 x10E3/uL   Basophils Absolute 0.0 0.0 - 0.2 x10E3/uL   Immature Granulocytes 0 Not Estab. %   Immature Grans (Abs) 0.0 0.0 - 0.1 x10E3/uL  .  Assessment & Plan:   Assessment & Plan Primary hypertension Hypertension not well-controlled with home readings around 150/70-80 mmHg. Previous medications caused adverse effects. Currently tolerating metoprolol  well. - Continue metoprolol  as prescribed. - Monitor blood pressure at home regularly.    Diabetes mellitus without complication (HCC) Well-controlled with hemoglobin A1c of 6.0%. - Continue metformin  extended release 500 mg twice daily. - Scheduled follow-up for diabetes management in six months.    Acquired hypothyroidism Management per specialist,  Dr. Dartha     Mixed hyperlipidemia Managed with Lovaza , Zetia , and simvastatin . Triglycerides slightly elevated but improved. - Continue Lovaza  1 gram, two capsules twice daily. - Continue Zetia  10 mg daily. - Continue simvastatin  20 mg daily.    Senile osteoporosis Managed with alendronate . Bone density shows osteopenia. - Continue alendronate  70 mg once weekly. - Ensure adequate calcium  and vitamin D  intake.    Suspected sleep  apnea Awaiting sleep study. May contribute to cardiac arrhythmias. - Follow up on the status of the sleep study order.    At high risk for cardiac arrhythmia Cardiac arrhythmia with palpitations and previous ablation. Awaiting heart monitor results. - Await results from the heart monitor study.    Drug allergy Drug allergy: sulfa drugs Allergy to sulfa drugs, reactions with hydrochlorothiazide . - Avoid sulfa-containing medications.    Body mass index is 29.07 kg/m.   Meds ordered this encounter  Medications   DISCONTD: azithromycin (ZITHROMAX Z-PAK) 250 MG tablet    Sig: 2 DAILY FOR FIRST DAY, THEN DECREASE TO ONE DAILY FOR 4 MORE DAYS.    Dispense:  6 each    Refill:  0    No orders of the defined types were placed in this encounter.    LILLETTE Kato I Leal-Borjas,acting as a scribe for Abigail Free, MD.,have documented all relevant documentation on the behalf of Abigail Free, MD,as directed by  Abigail Free, MD while in the presence of Abigail Free, MD.   Follow-up: Return in about 6 months (around 10/23/2024) for chronic follow up.  An After Visit Summary was printed and given to the patient.  I attest that I have reviewed this visit and agree with the plan scribed by my staff.  Abigail Free, MD [facility] [phone]

## 2024-04-25 NOTE — Assessment & Plan Note (Addendum)
 Hypertension not well-controlled with home readings around 150/70-80 mmHg. Previous medications caused adverse effects. Currently tolerating metoprolol  well. - Continue metoprolol  as prescribed. - Monitor blood pressure at home regularly.

## 2024-04-25 NOTE — Assessment & Plan Note (Addendum)
 Well-controlled with hemoglobin A1c of 6.0%. - Continue metformin  extended release 500 mg twice daily. - Scheduled follow-up for diabetes management in six months.

## 2024-04-25 NOTE — Assessment & Plan Note (Addendum)
 Managed with Lovaza , Zetia , and simvastatin . Triglycerides slightly elevated but improved. - Continue Lovaza  1 gram, two capsules twice daily. - Continue Zetia  10 mg daily. - Continue simvastatin  20 mg daily.

## 2024-04-29 DIAGNOSIS — M810 Age-related osteoporosis without current pathological fracture: Secondary | ICD-10-CM | POA: Insufficient documentation

## 2024-04-29 DIAGNOSIS — Z889 Allergy status to unspecified drugs, medicaments and biological substances status: Secondary | ICD-10-CM | POA: Insufficient documentation

## 2024-04-29 DIAGNOSIS — R29818 Other symptoms and signs involving the nervous system: Secondary | ICD-10-CM | POA: Insufficient documentation

## 2024-04-29 DIAGNOSIS — Z9189 Other specified personal risk factors, not elsewhere classified: Secondary | ICD-10-CM | POA: Insufficient documentation

## 2024-04-29 NOTE — Assessment & Plan Note (Addendum)
 Cardiac arrhythmia with palpitations and previous ablation. Awaiting heart monitor results. - Await results from the heart monitor study.

## 2024-04-29 NOTE — Assessment & Plan Note (Addendum)
 Managed with alendronate . Bone density shows osteopenia. - Continue alendronate  70 mg once weekly. - Ensure adequate calcium  and vitamin D  intake.

## 2024-04-29 NOTE — Assessment & Plan Note (Addendum)
 Drug allergy: sulfa drugs Allergy to sulfa drugs, reactions with hydrochlorothiazide . - Avoid sulfa-containing medications.

## 2024-04-29 NOTE — Assessment & Plan Note (Addendum)
 Awaiting sleep study. May contribute to cardiac arrhythmias. - Follow up on the status of the sleep study order.

## 2024-05-01 ENCOUNTER — Ambulatory Visit: Payer: Self-pay | Admitting: Cardiology

## 2024-05-01 DIAGNOSIS — Z9889 Other specified postprocedural states: Secondary | ICD-10-CM

## 2024-05-01 DIAGNOSIS — R002 Palpitations: Secondary | ICD-10-CM | POA: Diagnosis not present

## 2024-05-01 DIAGNOSIS — Z8679 Personal history of other diseases of the circulatory system: Secondary | ICD-10-CM | POA: Diagnosis not present

## 2024-05-01 NOTE — Progress Notes (Signed)
 Your palpitations are related to brief elevated heart rate in the upper chamber of the heart, no evidence of atrial fibrillation or any other dangerous arrhythmias.  Hence would continue present medications.  On the days that you feel these palpitations more frequently, you are presently on Nebivolol  10 mg daily, you can certainly take 1 extra tablet or we can go ahead and increase Nebivolol  to 20 mg daily.  Happy to resend a new prescription as well.

## 2024-05-05 ENCOUNTER — Encounter: Payer: Self-pay | Admitting: Cardiology

## 2024-05-05 DIAGNOSIS — G4733 Obstructive sleep apnea (adult) (pediatric): Secondary | ICD-10-CM | POA: Diagnosis not present

## 2024-05-07 ENCOUNTER — Ambulatory Visit: Attending: Cardiology

## 2024-05-07 DIAGNOSIS — R0683 Snoring: Secondary | ICD-10-CM

## 2024-05-07 DIAGNOSIS — R002 Palpitations: Secondary | ICD-10-CM

## 2024-05-07 NOTE — Procedures (Signed)
   SLEEP STUDY REPORT Patient Information Study Date: 05/05/2024 Patient Name: Claudia Shelton Patient ID: [id number] Birth Date: 1960-06-19 Age: 63 Gender: Female BMI: 29.4 (W=172 lb, H=5' 4'') Referring Physician: Erick Bergamo, MD  TEST DESCRIPTION: Home sleep apnea testing was completed using the WatchPat, a Type 1 device, utilizing  peripheral arterial tonometry (PAT), chest movement, actigraphy, pulse oximetry, pulse rate, body position and snore.  AHI was calculated with apnea and hypopnea using valid sleep time as the denominator. RDI includes apneas,  hypopneas, and RERAs. The data acquired and the scoring of sleep and all associated events were performed in  accordance with the recommended standards and specifications as outlined in the AASM Manual for the Scoring of  Sleep and Associated Events 2.2.0 (2015).  FINDINGS:  1. Severe Obstructive Sleep Apnea with AHI 46.5/hr.   2. No Central Sleep Apnea with pAHIc 2/hr.  3. Oxygen desaturations as low as 75%.  4. Severe snoring was present. O2 sats were < 88% for 25 min.  5. Total sleep time was 7 hrs and 52 min.  6. 16.2 % of total sleep time was spent in REM sleep.   7. Normal sleep onset latency at 17 min.   8. Prolonged REM sleep onset latency at 117 min.   9. Total awakenings were 6.  10. Arrhythmia detection: None  DIAGNOSIS:  Severe Obstructive Sleep Apnea (G47.33) Nocturnal Hypoxemia  RECOMMENDATIONS: 1. Clinical correlation of these findings is necessary. The decision to treat obstructive sleep apnea (OSA) is usually  based on the presence of apnea symptoms or the presence of associated medical conditions such as Hypertension,  Congestive Heart Failure, Atrial Fibrillation or Obesity. The most common symptoms of OSA are snoring, gasping for  breath while sleeping, daytime sleepiness and fatigue.  2. Initiating apnea therapy is recommended given the presence of symptoms and/or associated conditions.  Recommend  proceeding with one of the following:  a. Auto-CPAP therapy with a pressure range of 5-20cm H2O.  b. An oral appliance (OA) that can be obtained from certain dentists with expertise in sleep medicine. These are  primarily of use in non-obese patients with mild and moderate disease.  c. An ENT consultation which may be useful to look for specific causes of obstruction and possible treatment  options.  d. If patient is intolerant to PAP therapy, consider referral to ENT for evaluation for hypoglossal nerve stimulator.  3. Close follow-up is necessary to ensure success with CPAP or oral appliance therapy for maximum benefit . 4. A follow-up oximetry study on CPAP is recommended to assess the adequacy of therapy and determine the need  for supplemental oxygen or the potential need for Bi-level therapy. An arterial blood gas to determine the adequacy of  baseline ventilation and oxygenation should also be considered. 5. Healthy sleep recommendations include: adequate nightly sleep (normal 7-9 hrs/night), avoidance of caffeine after  noon and alcohol near bedtime, and maintaining a sleep environment that is cool, dark and quiet. 6. Weight loss for overweight patients is recommended. Even modest amounts of weight loss can significantly  improve the severity of sleep apnea. 7. Snoring recommendations include: weight loss where appropriate, side sleeping, and avoidance of alcohol before  bed. 8. Operation of motor vehicle should be avoided when sleepy.  Signature: Wilbert Bihari, MD; [facility]; Diplomat, American Board of Sleep  Medicine Electronically Signed: 05/07/2024 4:52:21 PM

## 2024-05-08 ENCOUNTER — Telehealth: Payer: Self-pay | Admitting: *Deleted

## 2024-05-08 DIAGNOSIS — I1A0 Resistant hypertension: Secondary | ICD-10-CM

## 2024-05-08 DIAGNOSIS — G4733 Obstructive sleep apnea (adult) (pediatric): Secondary | ICD-10-CM

## 2024-05-08 DIAGNOSIS — R0683 Snoring: Secondary | ICD-10-CM

## 2024-05-08 DIAGNOSIS — I10 Essential (primary) hypertension: Secondary | ICD-10-CM

## 2024-05-08 NOTE — Telephone Encounter (Signed)
 The patient has been notified of the result and verbalized understanding.  All questions (if any) were answered. Joshua Dalton Seip, CMA 05/08/2024 2:53 PM    Precert titration

## 2024-05-08 NOTE — Telephone Encounter (Signed)
-----   Message from Claudia Shelton sent at 05/07/2024  4:52 PM EST ----- Please let patient know that they have sleep apnea.  Recommend therapeutic CPAP titration for treatment of patient's sleep disordered breathing.

## 2024-05-16 ENCOUNTER — Ambulatory Visit: Attending: Cardiology | Admitting: Cardiology

## 2024-05-16 ENCOUNTER — Encounter: Payer: Self-pay | Admitting: Cardiology

## 2024-05-16 VITALS — BP 178/94 | HR 58 | Ht 64.8 in | Wt 170.2 lb

## 2024-05-16 DIAGNOSIS — I1A0 Resistant hypertension: Secondary | ICD-10-CM | POA: Diagnosis not present

## 2024-05-16 DIAGNOSIS — Z9889 Other specified postprocedural states: Secondary | ICD-10-CM

## 2024-05-16 DIAGNOSIS — G4733 Obstructive sleep apnea (adult) (pediatric): Secondary | ICD-10-CM

## 2024-05-16 DIAGNOSIS — Z8679 Personal history of other diseases of the circulatory system: Secondary | ICD-10-CM

## 2024-05-16 MED ORDER — CARVEDILOL 12.5 MG PO TABS: 12.5000 mg | ORAL_TABLET | Freq: Two times a day (BID) | ORAL | 3 refills | Status: AC

## 2024-05-16 NOTE — Progress Notes (Signed)
 Cardiology Office Note:  .   Date:  05/16/2024  ID:  Claudia Shelton, DOB December 05, 1960, MRN [id number] PCP: Sherre Clapper, MD  [facility] Providers Cardiologist:  Gordy Bergamo, MD {  History of Present Illness: .   Claudia Shelton is a 63 y.o. female with history of atrial flutter status post ablation 01/2016, hypertension, hyperlipidemia, hypothyroidism, osteoporosis, diabetes, OSA     Atrial flutter Ablation 01/2016, off anticoagulation No evidence of recurrence based on heart monitors up to 04/2024.    Hypertension HCTZ causing leg cramps, amlodipine  causing leg edema, losartan  causing hyperkalemia and elevated serum creatinine, spironolactone /HCTZ caused nausea and vomiting. Previously tried Nebivolol , this also has been stopped and eventually transition to metoprolol . 11/2020 preserved EF with mild concentric LVH. 03/2024 no RAS on renal Dopplers     Patient with history of atrial flutter status post ablation without documented recurrence.  Also with issues with blood pressure and inability to tolerate multiple medications.  She was seen 02/2024 and blood pressure remained to be poorly controlled.  Spironolactone /HCTZ 25/25 mg was added (but eventually discontinued).  Renal artery duplex ultrasounds were ordered which did not show evidence of RAS, there was a simple cyst in the left kidney.  She also had complained of palpitations and there was concern of recurrence of atrial flutter, heart monitor ordered that did not show any A-fib/flutter.  She was also referred to Dr. Shlomo and diagnosed with OSA and started on CPAP.  Today patient presents for follow-up.  Blood pressure continues to remain uncontrolled and she did not tolerate her spironolactone /HCTZ.  Also reports having decreased urinary output on ARB.  Continues to report intermittent palpitations but not of any significant concern for her.  Otherwise she is is doing fine otherwise, and accompanied with her husband today.  Having no  exertional limitations.  No other complaints today.  Reports having blood pressure issues for the last 10-15+ years.  ROS: Denies: Chest pain, shortness of breath, orthopnea, peripheral edema, palpitations, decreased exercise intolerance, fatigue, lightheadedness.   Studies Reviewed: SABRA        Physical Exam:   VS:  BP (!) 178/94   Pulse (!) 58   Ht 5' 4.8 (1.646 m)   Wt 170 lb 3.2 oz (77.2 kg)   SpO2 97%   BMI 28.50 kg/m    Wt Readings from Last 3 Encounters:  05/16/24 170 lb 3.2 oz (77.2 kg)  04/25/24 173 lb 9.6 oz (78.7 kg)  03/08/24 174 lb 3.2 oz (79 kg)    GEN: Well nourished, well developed in no acute distress NECK: No JVD; No carotid bruits CARDIAC: RRR, no murmurs, rubs, gallops RESPIRATORY:  Clear to auscultation without rales, wheezing or rhonchi  ABDOMEN: Soft, non-tender, non-distended EXTREMITIES:  No edema; No deformity   ASSESSMENT AND PLAN: .    Atrial flutter -ablation 01/2016 No documented recurrences after ablation with reassuring heart monitor 04/2024.  Appears in sinus today based on physical exam.  Still continues to have mild palpitations though but not significant for patient. No longer on anticoagulation after ablation. Continue beta-blocker  Hypertension Blood pressure remains uncontrolled 178/94 today.  This has been a chronic issue and complicated by multiple medication intolerances. HCTZ causing leg cramps, amlodipine  causing leg edema, losartan  causing hyperkalemia and elevated serum creatinine, spironolactone /HCTZ caused nausea and vomiting. She is currently only on Lopressor  50 mg twice daily, will transition this to carvedilol  12.5 mg twice daily. Log blood pressure for the next 1 to 2 weeks  and send results via MyChart.  She will let us  know how she tolerates carvedilol . No evidence of RAS on renal ultrasound 03/2024. She still has multiple other medications within the classes that we could try that she may tolerate.  For now we will slowly  titrate given so many intolerances.  Would ideally like to avoid hydralazine given its inconsistent BP effects but may need to consider. Also may consider referral to advanced hypertension clinic depending on the above response.  Hyperlipidemia Continue with simvastatin  20 mg daily.  LDL 48 04/2024.  OSA She has severe OSA which is likely driving her uncontrolled blood pressures.  She is still undergoing evaluation for this and waiting to get CPAP machine.  This will be paramount to the management of her blood pressure.  Additionally weight loss would be beneficial to.  BMI 28.5.  Diabetes A1c 6%.  Dispo: 1 to 72-month follow-up to follow-up on blood pressure  Signed, Thom LITTIE Sluder, PA-C

## 2024-05-16 NOTE — Patient Instructions (Signed)
 Medication Instructions:  Stop Metoprolol  Start Coreg  12.5mg  2 times per day. *If you need a refill on your cardiac medications before your next appointment, please call your pharmacy*  Lab Work: None  If you have labs (blood work) drawn today and your tests are completely normal, you will receive your results only by: MyChart Message (if you have MyChart) OR A paper copy in the mail If you have any lab test that is abnormal or we need to change your treatment, we will call you to review the results.  Testing/Procedures: None   Follow-Up: At [facility], you and your health needs are our priority.  As part of our continuing mission to provide you with exceptional heart care, our providers are all part of one team.  This team includes your primary Cardiologist (physician) and Advanced Practice Providers or APPs (Physician Assistants and Nurse Practitioners) who all work together to provide you with the care you need, when you need it.  Your next appointment:   1 - 2  month(s)  Provider:   Gordy Bergamo, MD  or Thom Sluder, PA-C   We recommend signing up for the patient portal called MyChart.  Sign up information is provided on this After Visit Summary.  MyChart is used to connect with patients for Virtual Visits (Telemedicine).  Patients are able to view lab/test results, encounter notes, upcoming appointments, etc.  Non-urgent messages can be sent to your provider as well.   To learn more about what you can do with MyChart, go to [email or web address].   Other Instructions None

## 2024-05-17 ENCOUNTER — Ambulatory Visit: Admitting: Cardiology

## 2024-05-21 ENCOUNTER — Telehealth: Payer: Self-pay | Admitting: Physician Assistant

## 2024-05-21 NOTE — Telephone Encounter (Signed)
   The patient called the answering service after-hours today. She was recently switched from metoprolol  to carvedilol  12.5mg  BID. Complex case as outlined in note, complicated by multiple medication intolerances. Today while taking a shower she noticed a rash on the inside of her arms bilaterally which has been itching. She called her pharmacist who advised she discontinue carvedilol  and call us  in the morning. She wanted to know what to do tonight. She is not having any difficulty breathing, swelling or any other acute reaction. She had no issue while on metoprolol  50mg  BID. I advised she d/c carvedilol , return to metoprolol  50mg  BID as previously on, and utilize the OTC benadryl she has on hand according to the instructions on the bottle (typically 25-50mg  q6hours as needed with caution towards sleepiness). ER precautions reviewed. Will forward to Sheng Haley PA-C for review and advisement on next steps thereafter.   Kierre Hintz N Anuar Walgren, PA-C

## 2024-05-23 NOTE — Telephone Encounter (Signed)
 The patient stated that she is allergic to adhesive, that it breaks her out but that she would try to use the clonidine patch.  I told the patient that I would tell Darryle, PA-C that she is allergic to adhesives and call her back.

## 2024-05-24 MED ORDER — CLONIDINE 0.1 MG/24HR TD PTWK: 0.1000 mg | MEDICATED_PATCH | TRANSDERMAL | 1 refills | Status: DC

## 2024-05-24 NOTE — Telephone Encounter (Signed)
 I left the patient a message that I was checking to make sure she got her clonidine patches.

## 2024-06-08 NOTE — Telephone Encounter (Signed)
**Note De-Identified Holdyn Poyser Obfuscation** Per the [facility] Provider Portal, No Authorization Required for CPT Code: 04188 (CPAP Titration).   I have transferred the order to the sleep lab.  I have made the pt aware that a PA is not required for her CPAP Titration and I provided her with the Sleep Lab's phone number so she can call them to be scheduled.  She verbalized understanding and thanked me for my call.

## 2024-06-15 ENCOUNTER — Other Ambulatory Visit: Payer: Self-pay | Admitting: Cardiology

## 2024-06-24 ENCOUNTER — Encounter: Payer: Self-pay | Admitting: Cardiology

## 2024-06-26 ENCOUNTER — Telehealth: Payer: Self-pay | Admitting: Cardiology

## 2024-06-26 MED ORDER — METOPROLOL TARTRATE 50 MG PO TABS: 50.0000 mg | ORAL_TABLET | Freq: Two times a day (BID) | ORAL | 3 refills | Status: AC

## 2024-06-26 NOTE — Telephone Encounter (Signed)
 Pt c/o medication issue:  1. Name of Medication: metoprolol  succinate (TOPROL  XL) 25 MG 24 hr tablet   2. How are you currently taking this medication (dosage and times per day)?   3. Are you having a reaction (difficulty breathing--STAT)? no  4. What is your medication issue? Pt requesting new prescription but showing as discontinued. States Lopressor  is the one she believes shut her kidneys down. Please advise.   Pt is completely out and her BP is up.

## 2024-06-28 ENCOUNTER — Ambulatory Visit: Admitting: Cardiology

## 2024-06-30 ENCOUNTER — Other Ambulatory Visit: Payer: Self-pay | Admitting: Family Medicine

## 2024-07-12 NOTE — Progress Notes (Signed)
 Severe Obstructive Sleep Apnea will follow

## 2024-07-12 NOTE — Progress Notes (Signed)
 I had ordered this study and I will make sure Wilbert Bihari, MD follows up on her.

## 2024-07-13 ENCOUNTER — Other Ambulatory Visit: Payer: Self-pay | Admitting: Family Medicine

## 2024-07-16 ENCOUNTER — Other Ambulatory Visit: Payer: Self-pay | Admitting: Cardiology

## 2024-07-19 ENCOUNTER — Other Ambulatory Visit: Payer: Self-pay | Admitting: Family Medicine

## 2024-07-20 ENCOUNTER — Other Ambulatory Visit: Payer: Self-pay

## 2024-07-20 DIAGNOSIS — E119 Type 2 diabetes mellitus without complications: Secondary | ICD-10-CM

## 2024-07-20 DIAGNOSIS — E782 Mixed hyperlipidemia: Secondary | ICD-10-CM

## 2024-07-20 DIAGNOSIS — I10 Essential (primary) hypertension: Secondary | ICD-10-CM

## 2024-07-23 ENCOUNTER — Encounter: Payer: 59 | Admitting: Family Medicine

## 2024-07-23 ENCOUNTER — Other Ambulatory Visit

## 2024-07-26 ENCOUNTER — Other Ambulatory Visit

## 2024-07-31 ENCOUNTER — Encounter

## 2024-08-02 ENCOUNTER — Ambulatory Visit: Admitting: "Endocrinology

## 2024-08-07 ENCOUNTER — Ambulatory Visit: Admitting: Cardiology
